# Patient Record
Sex: Female | Born: 1965 | Race: White | Hispanic: No | Marital: Married | Smoking: Current every day smoker
Health system: Southern US, Community
[De-identification: ages and names within clinical notes are randomized; demographics above are authoritative.]

## PROBLEM LIST (undated history)

## (undated) DIAGNOSIS — N809 Endometriosis, unspecified: Secondary | ICD-10-CM

## (undated) DIAGNOSIS — D4959 Neoplasm of unspecified behavior of other genitourinary organ: Secondary | ICD-10-CM

## (undated) DIAGNOSIS — F419 Anxiety disorder, unspecified: Secondary | ICD-10-CM

## (undated) DIAGNOSIS — A630 Anogenital (venereal) warts: Secondary | ICD-10-CM

## (undated) DIAGNOSIS — F32A Depression, unspecified: Secondary | ICD-10-CM

## (undated) DIAGNOSIS — M069 Rheumatoid arthritis, unspecified: Secondary | ICD-10-CM

## (undated) DIAGNOSIS — F329 Major depressive disorder, single episode, unspecified: Secondary | ICD-10-CM

## (undated) DIAGNOSIS — G43909 Migraine, unspecified, not intractable, without status migrainosus: Secondary | ICD-10-CM

## (undated) DIAGNOSIS — B019 Varicella without complication: Secondary | ICD-10-CM

## (undated) HISTORY — DX: Anogenital (venereal) warts: A63.0

## (undated) HISTORY — DX: Migraine, unspecified, not intractable, without status migrainosus: G43.909

## (undated) HISTORY — DX: Depression, unspecified: F32.A

## (undated) HISTORY — DX: Rheumatoid arthritis, unspecified: M06.9

## (undated) HISTORY — DX: Neoplasm of unspecified behavior of other genitourinary organ: D49.59

## (undated) HISTORY — PX: TONSILLECTOMY: SUR1361

## (undated) HISTORY — DX: Varicella without complication: B01.9

## (undated) HISTORY — DX: Anxiety disorder, unspecified: F41.9

## (undated) HISTORY — DX: Endometriosis, unspecified: N80.9

## (undated) HISTORY — DX: Major depressive disorder, single episode, unspecified: F32.9

---
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
## 1985-06-12 HISTORY — PX: BREAST BIOPSY: SHX20

## 2001-06-12 HISTORY — PX: ABDOMINAL HYSTERECTOMY: SHX81

## 2013-07-21 DIAGNOSIS — H9319 Tinnitus, unspecified ear: Secondary | ICD-10-CM | POA: Insufficient documentation

## 2013-07-21 DIAGNOSIS — Z981 Arthrodesis status: Secondary | ICD-10-CM | POA: Insufficient documentation

## 2013-07-21 DIAGNOSIS — M542 Cervicalgia: Secondary | ICD-10-CM | POA: Insufficient documentation

## 2013-07-21 DIAGNOSIS — F329 Major depressive disorder, single episode, unspecified: Secondary | ICD-10-CM | POA: Insufficient documentation

## 2013-07-21 DIAGNOSIS — M26609 Unspecified temporomandibular joint disorder, unspecified side: Secondary | ICD-10-CM | POA: Insufficient documentation

## 2013-07-21 DIAGNOSIS — H6980 Other specified disorders of Eustachian tube, unspecified ear: Secondary | ICD-10-CM | POA: Insufficient documentation

## 2013-07-21 DIAGNOSIS — F32A Depression, unspecified: Secondary | ICD-10-CM | POA: Insufficient documentation

## 2013-10-10 DIAGNOSIS — F419 Anxiety disorder, unspecified: Secondary | ICD-10-CM

## 2013-10-10 DIAGNOSIS — F329 Major depressive disorder, single episode, unspecified: Secondary | ICD-10-CM | POA: Insufficient documentation

## 2013-10-10 DIAGNOSIS — F32A Depression, unspecified: Secondary | ICD-10-CM | POA: Insufficient documentation

## 2013-10-10 DIAGNOSIS — K219 Gastro-esophageal reflux disease without esophagitis: Secondary | ICD-10-CM | POA: Insufficient documentation

## 2014-06-12 HISTORY — PX: FOOT SURGERY: SHX648

## 2017-01-03 ENCOUNTER — Ambulatory Visit (INDEPENDENT_AMBULATORY_CARE_PROVIDER_SITE_OTHER): Payer: Self-pay | Admitting: Primary Care

## 2017-01-03 ENCOUNTER — Encounter: Payer: Self-pay | Admitting: Primary Care

## 2017-01-03 VITALS — BP 112/70 | HR 72 | Temp 98.3°F | Ht 62.25 in | Wt 125.4 lb

## 2017-01-03 DIAGNOSIS — E785 Hyperlipidemia, unspecified: Secondary | ICD-10-CM

## 2017-01-03 DIAGNOSIS — Z0000 Encounter for general adult medical examination without abnormal findings: Secondary | ICD-10-CM

## 2017-01-03 DIAGNOSIS — Z1211 Encounter for screening for malignant neoplasm of colon: Secondary | ICD-10-CM

## 2017-01-03 DIAGNOSIS — M069 Rheumatoid arthritis, unspecified: Secondary | ICD-10-CM | POA: Insufficient documentation

## 2017-01-03 DIAGNOSIS — Z1231 Encounter for screening mammogram for malignant neoplasm of breast: Secondary | ICD-10-CM

## 2017-01-03 DIAGNOSIS — Z720 Tobacco use: Secondary | ICD-10-CM | POA: Diagnosis not present

## 2017-01-03 DIAGNOSIS — Z1239 Encounter for other screening for malignant neoplasm of breast: Secondary | ICD-10-CM

## 2017-01-03 LAB — LIPID PANEL
CHOLESTEROL: 291 mg/dL — AB (ref 0–200)
HDL: 52.4 mg/dL (ref >39.00)
NonHDL: 238.4
Total CHOL/HDL Ratio: 6
Triglycerides: 229 mg/dL — ABNORMAL HIGH (ref 0.0–149.0)
VLDL: 45.8 mg/dL — ABNORMAL HIGH (ref 0.0–40.0)

## 2017-01-03 LAB — COMPREHENSIVE METABOLIC PANEL
ALBUMIN: 4.7 g/dL (ref 3.5–5.2)
ALK PHOS: 59 U/L (ref 39–117)
ALT: 20 U/L (ref 0–35)
AST: 14 U/L (ref 0–37)
BILIRUBIN TOTAL: 0.9 mg/dL (ref 0.2–1.2)
BUN: 17 mg/dL (ref 6–23)
CO2: 31 mEq/L (ref 19–32)
CREATININE: 0.85 mg/dL (ref 0.40–1.20)
Calcium: 9.8 mg/dL (ref 8.4–10.5)
Chloride: 101 mEq/L (ref 96–112)
GFR: 74.99 mL/min (ref >60.00)
GLUCOSE: 98 mg/dL (ref 70–99)
POTASSIUM: 4.1 meq/L (ref 3.5–5.1)
SODIUM: 136 meq/L (ref 135–145)
TOTAL PROTEIN: 7.4 g/dL (ref 6.0–8.3)

## 2017-01-03 LAB — LDL CHOLESTEROL, DIRECT: LDL DIRECT: 191 mg/dL

## 2017-01-03 MED ORDER — VARENICLINE TARTRATE 0.5 MG X 11 & 1 MG X 42 PO MISC: ORAL | 0 refills | Status: DC

## 2017-01-03 MED ORDER — VARENICLINE TARTRATE 1 MG PO TABS: 1.0000 mg | ORAL_TABLET | Freq: Two times a day (BID) | ORAL | 1 refills | Status: DC

## 2017-01-03 NOTE — Progress Notes (Signed)
Subjective:    Patient ID: Miranda Lara, female    DOB: [birth year], 51 y.o.   MRN: [id number]  HPI   Ms. Clavin is a 51 year old female who presents today to establish care, complete physical, and discuss the problems mentioned below. Will obtain old records.  1) Rheumatoid Arthritis: Diagnosed several years ago. Currently not on treatment, previously managed on Meloxicam, Plaquenil, methotrexate (once with terrible side effects). Overall her symptoms are manageable.   2) Anxiety and Depression: Diagnosed years ago. Once managed on "anxiety" medication, didn't like the way it made her feel. Overall manages well off of medications.   3) Hyperlipidemia: Lipid panel above goal in May 2015 from [facility]. TC of 282, LDL of 202.  4) Tobacco Abuse: Smoker since the age of 49. Smoked off and on for years, more consistently over the last several years. She's smoking 1/2 PPD and is ready to quit. Recent diagnosis of acute bronchitis and is currently under treatment. She would like to try Chantix.   Immunizations: -Tetanus: Completed in 2015 -Influenza: Did not complete last season.    Diet: She endorses a healthy diet. Breakfast: Skips during weekdays, omelette, potatoes, bacon during weekends. Lunch: Avocado, nuts, crackers, tomatoes, sandwich Dinner: Chicken, hamburgers, vegetables, starch (rice, pasta) Snacks: Nuts, crackers, fruit, avocado Desserts: Occasionally Beverages: Coffee, Juice, Water, beer  Exercise: She does not currently exercise. Eye exam: Completed in early 2018 Dental exam: Completed semi-annually Colonoscopy: Never completed. Due. Pap Smear: Hysterectomy  Mammogram: Completed 2 years ago, normal. Due.   Review of Systems  Constitutional: Negative for unexpected weight change.  HENT: Negative for rhinorrhea.   Respiratory: Negative for cough and shortness of breath.        Improvement in cough since diagnosis of bronchitis.  Cardiovascular: Negative for chest  pain.  Gastrointestinal: Negative for constipation and diarrhea.  Genitourinary: Negative for difficulty urinating and menstrual problem.  Musculoskeletal: Negative for arthralgias and myalgias.  Skin: Negative for rash.  Allergic/Immunologic: Negative for environmental allergies.  Neurological: Negative for dizziness, numbness and headaches.  Psychiatric/Behavioral:       Denies concerns for anxiety or depression       Past Medical History:  Diagnosis Date  . Anxiety and depression   . Chickenpox   . Endometriosis   . Migraines   . Ovarian tumor   . Rheumatoid arthritis [facility])      Social History   Social History  . Marital status: Married    Spouse name: N/A  . Number of children: N/A  . Years of education: N/A   Occupational History  . Not on file.   Social History Main Topics  . Smoking status: Current Every Day Smoker    Packs/day: 0.50  . Smokeless tobacco: Never Used  . Alcohol use Yes  . Drug use: Unknown  . Sexual activity: Not on file   Other Topics Concern  . Not on file   Social History Narrative   Married.   2 children, 1 grandchild.   Works in [employer].   Enjoys yard work, spending time with family.    Originally from [place].     Past Surgical History:  Procedure Laterality Date  . ABDOMINAL HYSTERECTOMY  2003  . BREAST BIOPSY Left 1987  . FOOT SURGERY  2016  . [place]   ACF 5-6  . TONSILLECTOMY      Family History  Problem Relation Age of Onset  . Hypertension Mother   . Dementia Mother   .  Arthritis Maternal Grandmother   . Stroke Maternal Grandmother   . Hypertension Maternal Grandmother   . Dementia Maternal Grandmother   . Prostate cancer Maternal Grandfather   . Colon cancer Paternal Grandfather     No Known Allergies  No current outpatient prescriptions on file prior to visit.   No current facility-administered medications on file prior to visit.     BP 112/70   Pulse 72   Temp 98.3 F (36.8 C)  (Oral)   Ht 5' 2.25" (1.581 m)   Wt 125 lb 6.4 oz (56.9 kg)   SpO2 96%   BMI 22.75 kg/m    Objective:   Physical Exam  Constitutional: She is oriented to person, place, and time. She appears well-nourished.  HENT:  Right Ear: Tympanic membrane and ear canal normal.  Left Ear: Tympanic membrane and ear canal normal.  Nose: Nose normal.  Mouth/Throat: Oropharynx is clear and moist.  Eyes: Pupils are equal, round, and reactive to light. Conjunctivae and EOM are normal.  Neck: Neck supple. No thyromegaly present.  Cardiovascular: Normal rate and regular rhythm.   No murmur heard. Pulmonary/Chest: Effort normal and breath sounds normal. She has no rales.  Abdominal: Soft. Bowel sounds are normal. There is no tenderness.  Musculoskeletal: Normal range of motion.  Lymphadenopathy:    She has no cervical adenopathy.  Neurological: She is alert and oriented to person, place, and time. She has normal reflexes. No cranial nerve deficit.  Skin: Skin is warm and dry. No rash noted.  Psychiatric: She has a normal mood and affect.          Assessment & Plan:

## 2017-01-03 NOTE — Assessment & Plan Note (Signed)
Doing well off of medications.

## 2017-01-03 NOTE — Assessment & Plan Note (Signed)
Immunizations UTD. Mammogram due, pending. Colonoscopy due, referral placed. Commended her on her healthy diet, recommended regular exercise. Exam unremarkable. Labs pending. Will initiate Chantix for tobacco abuse.

## 2017-01-03 NOTE — Assessment & Plan Note (Signed)
Smoker since at 66. Ready to quit. Discussed several treatment options, she would like to try chantix.  Trial of chantix. Common side effects including rare risk of suicide ideation was discussed with the patient today.  Patient is instructed to go directly to the ED if this occurs.  We discussed that patient can continue to smoke for 1 week after starting chantix, but then must discontinue cigarettes.  He is also instructed to contact us prior to completion of the starter month pack for an rx for the continuation month pack.  5 minutes spent with patient today on tobacco cessation counseling.

## 2017-01-03 NOTE — Patient Instructions (Addendum)
Start Chantix for tobacco abuse. You must choose a quit date within the first 1-2 weeks. Follow the package instructions. Start with the Starting Month Pak, then refill the Continuing Month Pak.  Complete lab work prior to leaving today. I will notify you of your results once received.   Call the [facility] to schedule your mammogram.  You will be contacted regarding your referral to GI for the colonoscopy.  Please let us know if you have not heard back within one week.   Start exercising. You should be getting 150 minutes of moderate intensity exercise weekly.  Continue your efforts towards a healthy diet.  Follow up in 1 year for your annual exam or sooner if needed.  It was a pleasure to meet you today! Please don't hesitate to call me with any questions. Welcome to [facility]!

## 2017-01-03 NOTE — Assessment & Plan Note (Signed)
Noted from labs on [facility]. Will check Lipids today. Never on treatment for lipids.

## 2017-01-04 ENCOUNTER — Telehealth: Payer: Self-pay | Admitting: Primary Care

## 2017-01-04 NOTE — Telephone Encounter (Signed)
Patient returned Chan's call. Patient does want to start medication.  Patient said cholesterol medication can be called in to [facility].

## 2017-01-05 ENCOUNTER — Other Ambulatory Visit: Payer: Self-pay | Admitting: Primary Care

## 2017-01-05 DIAGNOSIS — E785 Hyperlipidemia, unspecified: Secondary | ICD-10-CM

## 2017-01-05 MED ORDER — ATORVASTATIN CALCIUM 20 MG PO TABS: 20.0000 mg | ORAL_TABLET | Freq: Every evening | ORAL | 3 refills | Status: DC

## 2017-01-05 NOTE — Telephone Encounter (Signed)
Spoken to patient and was notified medication has been sent to [facility]

## 2017-01-10 ENCOUNTER — Other Ambulatory Visit: Payer: Self-pay

## 2017-01-10 ENCOUNTER — Telehealth: Payer: Self-pay

## 2017-01-10 DIAGNOSIS — Z1211 Encounter for screening for malignant neoplasm of colon: Secondary | ICD-10-CM

## 2017-01-10 NOTE — Telephone Encounter (Signed)
Gastroenterology Pre-Procedure Review  Request Date: 02/27/17 Requesting Physician: Dr. Vicente Males  PATIENT REVIEW QUESTIONS: The patient responded to the following health history questions as indicated:    1. Are you having any GI issues? no 2. Do you have a personal history of Polyps? no 3. Do you have a family history of Colon Cancer or Polyps? yes (Mom ??Colon Polyps) 4. Diabetes Mellitus? no 5. Joint replacements in the past 12 months?no 6. Major health problems in the past 3 months?no 7. Any artificial heart valves, MVP, or defibrillator?no    MEDICATIONS & ALLERGIES:    Patient reports the following regarding taking any anticoagulation/antiplatelet therapy:   Plavix, Coumadin, Eliquis, Xarelto, Lovenox, Pradaxa, Brilinta, or Effient? no Aspirin? no  Patient confirms/reports the following medications:  Current Outpatient Prescriptions  Medication Sig Dispense Refill  . atorvastatin (LIPITOR) 20 MG tablet Take 1 tablet (20 mg total) by mouth every evening. 90 tablet 3  . PROVENTIL HFA 108 (90 Base) MCG/ACT inhaler INL 2 PFS ITL Q 4 H PRN  0  . varenicline (CHANTIX CONTINUING MONTH PAK) 1 MG tablet Take 1 tablet (1 mg total) by mouth 2 (two) times daily. 60 tablet 1  . varenicline (CHANTIX STARTING MONTH PAK) 0.5 MG X 11 & 1 MG X 42 tablet Take 0.5 mg by mouth once daily for 3 days, then increase to 0.5 mg twice daily for 4 days, then increase to 1 mg tablet twice daily. 53 tablet 0   No current facility-administered medications for this visit.     Patient confirms/reports the following allergies:  No Known Allergies  No orders of the defined types were placed in this encounter.   AUTHORIZATION INFORMATION Primary Insurance: 1D#: Group #:  Secondary Insurance: 1D#: Group #:  SCHEDULE INFORMATION: Date: 02/27/17 Time: Location:[facility]

## 2017-01-15 ENCOUNTER — Telehealth: Payer: Self-pay | Admitting: Gastroenterology

## 2017-01-15 NOTE — Telephone Encounter (Signed)
Patient LVM and needs to r/s her procedure for 02/27/17.

## 2017-01-15 NOTE — Telephone Encounter (Signed)
Pts colonscopy has been moved from 09/18 to 09/[birth year] because her birthday. Endo has been notified.

## 2017-01-30 ENCOUNTER — Ambulatory Visit
Admission: RE | Admit: 2017-01-30 | Discharge: 2017-01-30 | Disposition: A | Discharge status: Home / Self Care | Payer: 59 | Source: Ambulatory Visit | Attending: Primary Care | Admitting: Primary Care

## 2017-01-30 DIAGNOSIS — Z1239 Encounter for other screening for malignant neoplasm of breast: Secondary | ICD-10-CM

## 2017-01-30 DIAGNOSIS — Z1231 Encounter for screening mammogram for malignant neoplasm of breast: Secondary | ICD-10-CM | POA: Diagnosis present

## 2017-02-06 ENCOUNTER — Other Ambulatory Visit: Payer: Self-pay | Admitting: *Deleted

## 2017-02-06 ENCOUNTER — Inpatient Hospital Stay
Admission: RE | Admit: 2017-02-06 | Discharge: 2017-02-06 | Disposition: A | Discharge status: Home / Self Care | Payer: Self-pay | Source: Ambulatory Visit | Attending: *Deleted | Admitting: *Deleted

## 2017-02-06 DIAGNOSIS — Z9289 Personal history of other medical treatment: Secondary | ICD-10-CM

## 2017-02-14 ENCOUNTER — Telehealth: Payer: Self-pay | Admitting: Gastroenterology

## 2017-02-14 NOTE — Telephone Encounter (Signed)
Patient left a voice message to cancel per colonoscopy due to work. She will call us back to reschedule. I called [facility]

## 2017-03-01 ENCOUNTER — Encounter: Payer: Self-pay | Admitting: Primary Care

## 2017-03-01 ENCOUNTER — Ambulatory Visit (INDEPENDENT_AMBULATORY_CARE_PROVIDER_SITE_OTHER): Payer: 59 | Admitting: Primary Care

## 2017-03-01 VITALS — BP 118/70 | HR 71 | Temp 98.4°F | Ht 62.25 in | Wt 127.8 lb

## 2017-03-01 DIAGNOSIS — E785 Hyperlipidemia, unspecified: Secondary | ICD-10-CM

## 2017-03-01 LAB — HEPATIC FUNCTION PANEL
ALT: 21 U/L (ref 0–35)
AST: 21 U/L (ref 0–37)
Albumin: 4.2 g/dL (ref 3.5–5.2)
Alkaline Phosphatase: 61 U/L (ref 39–117)
BILIRUBIN TOTAL: 0.8 mg/dL (ref 0.2–1.2)
Bilirubin, Direct: 0.1 mg/dL (ref 0.0–0.3)
Total Protein: 6.5 g/dL (ref 6.0–8.3)

## 2017-03-01 LAB — LIPID PANEL
CHOL/HDL RATIO: 3
Cholesterol: 178 mg/dL (ref 0–200)
HDL: 54.7 mg/dL (ref >39.00)
LDL CALC: 95 mg/dL (ref 0–99)
NonHDL: 123.19
Triglycerides: 142 mg/dL (ref 0.0–149.0)
VLDL: 28.4 mg/dL (ref 0.0–40.0)

## 2017-03-01 NOTE — Progress Notes (Signed)
Subjective:    Patient ID: Miranda Lara, female    DOB: 01/25/1966, 51 y.o.   MRN: [id number]  HPI  Miranda Lara is a 51 year old female with a history of rheumatoid arthritis who presents today with a chief complaint of fatigue. She also reports body aches.   Her symptoms began about one month ago. Her body aches are located to her hands, feet, lower extremities, shoulders. She is currently managed on atorvastatin 20 mg which was initiated in late July 2018 for hyperlipidemia. She denies fevers, cough, sore throat, nausea, tick bites, weight changes, appetite changes.  Review of Systems  Constitutional: Positive for fatigue. Negative for appetite change, fever and unexpected weight change.  HENT: Negative for congestion.   Respiratory: Negative for cough and shortness of breath.   Cardiovascular: Negative for chest pain.  Gastrointestinal: Negative for nausea.  Musculoskeletal: Positive for arthralgias and myalgias. Negative for joint swelling.  Skin: Negative for rash.  Neurological: Negative for headaches.       Past Medical History:  Diagnosis Date  . Anxiety and depression   . Chickenpox   . Endometriosis   . Migraines   . Ovarian tumor   . Rheumatoid arthritis [facility])      Social History   Social History  . Marital status: Married    Spouse name: N/A  . Number of children: N/A  . Years of education: N/A   Occupational History  . Not on file.   Social History Main Topics  . Smoking status: Current Every Day Smoker    Packs/day: 0.50  . Smokeless tobacco: Never Used  . Alcohol use Yes  . Drug use: Unknown  . Sexual activity: Not on file   Other Topics Concern  . Not on file   Social History Narrative   Married.   2 children, 1 grandchild.   Works in [employer].   Enjoys yard work, spending time with family.    Originally from [place].     Past Surgical History:  Procedure Laterality Date  . ABDOMINAL HYSTERECTOMY  2003  . BREAST BIOPSY Left 1987  .  FOOT SURGERY  2016  . [place]   ACF 5-6  . TONSILLECTOMY      Family History  Problem Relation Age of Onset  . Hypertension Mother   . Dementia Mother   . Arthritis Maternal Grandmother   . Stroke Maternal Grandmother   . Hypertension Maternal Grandmother   . Dementia Maternal Grandmother   . Prostate cancer Maternal Grandfather   . Colon cancer Paternal Grandfather     No Known Allergies  Current Outpatient Prescriptions on File Prior to Visit  Medication Sig Dispense Refill  . atorvastatin (LIPITOR) 20 MG tablet Take 1 tablet (20 mg total) by mouth every evening. 90 tablet 3  . PROVENTIL HFA 108 (90 Base) MCG/ACT inhaler INL 2 PFS ITL Q 4 H PRN  0  . varenicline (CHANTIX CONTINUING MONTH PAK) 1 MG tablet Take 1 tablet (1 mg total) by mouth 2 (two) times daily. (Patient not taking: Reported on 03/01/2017) 60 tablet 1  . varenicline (CHANTIX STARTING MONTH PAK) 0.5 MG X 11 & 1 MG X 42 tablet Take 0.5 mg by mouth once daily for 3 days, then increase to 0.5 mg twice daily for 4 days, then increase to 1 mg tablet twice daily. (Patient not taking: Reported on 03/01/2017) 53 tablet 0   No current facility-administered medications on file prior to visit.  BP 118/70   Pulse 71   Temp 98.4 F (36.9 C) (Oral)   Ht 5' 2.25" (1.581 m)   Wt 127 lb 12.8 oz (58 kg)   SpO2 97%   BMI 23.19 kg/m    Objective:   Physical Exam  Constitutional: She appears well-nourished.  Neck: Neck supple.  Cardiovascular: Normal rate and regular rhythm.   Pulmonary/Chest: Effort normal and breath sounds normal.  Musculoskeletal: Normal range of motion.  Skin: Skin is warm and dry. No rash noted.          Assessment & Plan:

## 2017-03-01 NOTE — Patient Instructions (Signed)
Complete lab work prior to leaving today. I will notify you of your results once received.   Stop your atorvastatin medication for 2 weeks. Please update me on your symptoms at that time.  Timnath  It was a pleasure to see you today!

## 2017-03-01 NOTE — Assessment & Plan Note (Signed)
Repeat lipids and LFT's pending today. Highly suspect symptoms secondary to statin use given normal exam and HPI. Hold atorvastatin x 2 weeks, she will update [place] at that time. Consider low dose Crestor.

## 2017-03-06 ENCOUNTER — Ambulatory Visit: Admit: 2017-03-06 | Payer: 59 | Admitting: Gastroenterology

## 2017-03-06 SURGERY — COLONOSCOPY WITH PROPOFOL
Anesthesia: General

## 2017-03-15 ENCOUNTER — Telehealth: Payer: Self-pay | Admitting: Primary Care

## 2017-03-15 DIAGNOSIS — E785 Hyperlipidemia, unspecified: Secondary | ICD-10-CM

## 2017-03-15 NOTE — Telephone Encounter (Signed)
-----   Message from Pleas Koch, NP sent at 03/01/2017  7:36 AM EDT ----- Regarding: Myalgias Please check on patient since we stopped her atorvastatin, any body/muscle aches?

## 2017-03-16 MED ORDER — ROSUVASTATIN CALCIUM 5 MG PO TABS: 5.0000 mg | ORAL_TABLET | Freq: Every evening | ORAL | 0 refills | Status: DC

## 2017-03-16 NOTE — Telephone Encounter (Signed)
We need to change the atorvastatin to rosuvastatin (Crestor), I'll send this to her pharmacy.  Please reschedule labs for 6 weeks after she starts Crestor. Have her update me before then if she has any problems.

## 2017-03-16 NOTE — Telephone Encounter (Signed)
Spoken to patient and she stated that she is better since been off the atorvastatin.  She has been recently achy but not like before. Just letting Anda Kraft know.  Patient stated that she was wondering if she going to have a replacement medication. I also mention to her that her lab appt on 04/09/2017.  Can we wait to see how her levels are on the lab appt?

## 2017-03-19 NOTE — Telephone Encounter (Signed)
Spoken and notified patient of Kate's comments. Patient verbalized understanding.  Lab appt 04/23/2017

## 2017-03-20 ENCOUNTER — Ambulatory Visit (INDEPENDENT_AMBULATORY_CARE_PROVIDER_SITE_OTHER): Payer: 59 | Admitting: Primary Care

## 2017-03-20 ENCOUNTER — Encounter: Payer: Self-pay | Admitting: Primary Care

## 2017-03-20 VITALS — BP 114/72 | HR 69 | Temp 98.0°F | Ht 62.25 in | Wt 128.1 lb

## 2017-03-20 DIAGNOSIS — R5383 Other fatigue: Secondary | ICD-10-CM | POA: Diagnosis not present

## 2017-03-20 DIAGNOSIS — M069 Rheumatoid arthritis, unspecified: Secondary | ICD-10-CM

## 2017-03-20 LAB — SEDIMENTATION RATE: SED RATE: 7 mm/h (ref 0–30)

## 2017-03-20 LAB — CBC
HEMATOCRIT: 39.9 % (ref 36.0–46.0)
HEMOGLOBIN: 13.4 g/dL (ref 12.0–15.0)
MCHC: 33.7 g/dL (ref 30.0–36.0)
MCV: 101.3 fl — AB (ref 78.0–100.0)
Platelets: 243 10*3/uL (ref 150.0–400.0)
RBC: 3.94 Mil/uL (ref 3.87–5.11)
RDW: 12.6 % (ref 11.5–15.5)
WBC: 4.7 10*3/uL (ref 4.0–10.5)

## 2017-03-20 LAB — TSH: TSH: 1.34 u[IU]/mL (ref 0.35–4.50)

## 2017-03-20 NOTE — Assessment & Plan Note (Signed)
Over the past 2 months.  Could be secondary to potential RA flare, depression, other metabolic cause. Check labs today. Offered therapy for depression, she will think about this.

## 2017-03-20 NOTE — Patient Instructions (Signed)
Complete lab work prior to leaving today. I will notify you of your results once received.   Trial the rosuvastatin medication for high cholesterol and update me if your symptoms get worse.  It was a pleasure to see you today!

## 2017-03-20 NOTE — Assessment & Plan Note (Signed)
Recent arthralgias could be secondary to RA, was also on atorvastatin several weeks ago, has stopped since. Check RF, Sed rate, CBC, TSH today. Consider Cymbalta for treatment vs rheumatology referral.

## 2017-03-20 NOTE — Progress Notes (Signed)
Subjective:    Patient ID: Miranda Lara, female    DOB: 01-May-1966, 51 y.o.   MRN: [id number]  HPI  Miranda Lara is a 51 year old female who presents today with a chief complaint of fatigue. She also reports arthralgias. Previously managed on atorvastatin which caused an increase in symptoms, improved after a two week holiday. She was switched to rosuvastatin last week given increased symptoms with atorvastatin.  One week she started feeling "exhausted" despite a full nights sleep. Her fatigue began 2 months ago with progression of fatigue since. She's also experiencing joint aches to her ankles, knees, upper and lower extremities that began about 2 months ago after she started atorvastatin, improved one week after she stopped taking, then symptoms resumed. She does have a history of achy hands/feet but not to this extent. Previously followed with rheumatology and managed on methotrexate and other various medications without improvement.   She's also felt depressed/down intermittently for years and more so recently given her recent symptoms. She does endorse a history of difficulty resting until things at home are neat and tidy. She thinks she has some obsessive compulsive disorder.  PHQ 9 score of 12 today.   Review of Systems  Constitutional: Positive for fatigue.  HENT: Negative for trouble swallowing.   Respiratory: Negative for shortness of breath.   Cardiovascular: Negative for chest pain and palpitations.  Endocrine: Negative for cold intolerance.  Musculoskeletal: Positive for arthralgias and myalgias.  Psychiatric/Behavioral:       See HPI       Past Medical History:  Diagnosis Date  . Anxiety and depression   . Chickenpox   . Endometriosis   . Migraines   . Ovarian tumor   . Rheumatoid arthritis [facility])      Social History   Social History  . Marital status: Married    Spouse name: N/A  . Number of children: N/A  . Years of education: N/A   Occupational History  . Not  on file.   Social History Main Topics  . Smoking status: Current Every Day Smoker    Packs/day: 0.50  . Smokeless tobacco: Never Used  . Alcohol use Yes  . Drug use: Unknown  . Sexual activity: Not on file   Other Topics Concern  . Not on file   Social History Narrative   Married.   2 children, 1 grandchild.   Works in [employer].   Enjoys yard work, spending time with family.    Originally from [place].     Past Surgical History:  Procedure Laterality Date  . ABDOMINAL HYSTERECTOMY  2003  . BREAST BIOPSY Left 1987  . FOOT SURGERY  2016  . [place]   ACF 5-6  . TONSILLECTOMY      Family History  Problem Relation Age of Onset  . Hypertension Mother   . Dementia Mother   . Arthritis Maternal Grandmother   . Stroke Maternal Grandmother   . Hypertension Maternal Grandmother   . Dementia Maternal Grandmother   . Prostate cancer Maternal Grandfather   . Colon cancer Paternal Grandfather     No Known Allergies  Current Outpatient Prescriptions on File Prior to Visit  Medication Sig Dispense Refill  . PROVENTIL HFA 108 (90 Base) MCG/ACT inhaler INL 2 PFS ITL Q 4 H PRN  0  . rosuvastatin (CRESTOR) 5 MG tablet Take 1 tablet (5 mg total) by mouth every evening. (Patient not taking: Reported on 03/20/2017) 90 tablet 0  .  varenicline (CHANTIX CONTINUING MONTH PAK) 1 MG tablet Take 1 tablet (1 mg total) by mouth 2 (two) times daily. (Patient not taking: Reported on 03/01/2017) 60 tablet 1  . varenicline (CHANTIX STARTING MONTH PAK) 0.5 MG X 11 & 1 MG X 42 tablet Take 0.5 mg by mouth once daily for 3 days, then increase to 0.5 mg twice daily for 4 days, then increase to 1 mg tablet twice daily. (Patient not taking: Reported on 03/01/2017) 53 tablet 0   No current facility-administered medications on file prior to visit.     BP 114/72   Pulse 69   Temp 98 F (36.7 C) (Oral)   Ht 5' 2.25" (1.581 m)   Wt 128 lb 1.9 oz (58.1 kg)   SpO2 98%   BMI 23.25 kg/m     Objective:   Physical Exam  Constitutional: She appears well-nourished.  Neck: Neck supple. No thyromegaly present.  Cardiovascular: Normal rate.   Pulmonary/Chest: Effort normal.  Musculoskeletal:  No obvious joint swelling to hands. General decrease in ROM to bilateral hands.  Skin: Skin is warm and dry.  Psychiatric: She has a normal mood and affect.          Assessment & Plan:

## 2017-03-21 LAB — RHEUMATOID FACTOR

## 2017-03-22 ENCOUNTER — Other Ambulatory Visit: Payer: Self-pay | Admitting: Primary Care

## 2017-03-22 DIAGNOSIS — F330 Major depressive disorder, recurrent, mild: Secondary | ICD-10-CM | POA: Insufficient documentation

## 2017-03-22 DIAGNOSIS — M2550 Pain in unspecified joint: Secondary | ICD-10-CM

## 2017-03-22 MED ORDER — DULOXETINE HCL 20 MG PO CPEP: 20.0000 mg | ORAL_CAPSULE | Freq: Every day | ORAL | 1 refills | Status: DC

## 2017-03-22 NOTE — Assessment & Plan Note (Signed)
Rheumatology work up negative. Will start Cymbalta for symptoms, discussed this during her last visit, she has decided to proceed. Will call for an update in 2 weeks.

## 2017-03-22 NOTE — Assessment & Plan Note (Signed)
History of for most of her life, overall able to manage. Increased symptoms of depression over the last year.  Given history of arthralgias, will start Cymbalta 20 mg.  Discussed potential side effects at last visit, she verbalized understanding. Will call her for an update in 2 weeks.

## 2017-04-05 ENCOUNTER — Telehealth: Payer: Self-pay | Admitting: Primary Care

## 2017-04-05 NOTE — Telephone Encounter (Signed)
Please check on patient: How is she doing since we started Cymbalta for joint aches and depression?

## 2017-04-06 NOTE — Telephone Encounter (Signed)
Per DPR, left detail message of Kate's comments for patient to call back. 

## 2017-04-09 ENCOUNTER — Other Ambulatory Visit: Payer: 59

## 2017-04-10 NOTE — Telephone Encounter (Signed)
Per DPR, left detail message of Kate's comments for patient to call back. 

## 2017-04-17 ENCOUNTER — Ambulatory Visit: Payer: 59 | Admitting: Sports Medicine

## 2017-04-23 ENCOUNTER — Other Ambulatory Visit (INDEPENDENT_AMBULATORY_CARE_PROVIDER_SITE_OTHER): Payer: 59

## 2017-04-23 DIAGNOSIS — E785 Hyperlipidemia, unspecified: Secondary | ICD-10-CM

## 2017-04-23 LAB — HEPATIC FUNCTION PANEL
ALBUMIN: 4.4 g/dL (ref 3.5–5.2)
ALK PHOS: 51 U/L (ref 39–117)
ALT: 23 U/L (ref 0–35)
AST: 20 U/L (ref 0–37)
BILIRUBIN DIRECT: 0.1 mg/dL (ref 0.0–0.3)
TOTAL PROTEIN: 7 g/dL (ref 6.0–8.3)
Total Bilirubin: 0.7 mg/dL (ref 0.2–1.2)

## 2017-04-23 LAB — LIPID PANEL
CHOL/HDL RATIO: 5
Cholesterol: 233 mg/dL — ABNORMAL HIGH (ref 0–200)
HDL: 51.3 mg/dL (ref >39.00)
LDL CALC: 152 mg/dL — AB (ref 0–99)
NONHDL: 181.31
TRIGLYCERIDES: 149 mg/dL (ref 0.0–149.0)
VLDL: 29.8 mg/dL (ref 0.0–40.0)

## 2017-04-26 ENCOUNTER — Telehealth: Payer: Self-pay | Admitting: Internal Medicine

## 2017-04-26 NOTE — Telephone Encounter (Signed)
1. Probably not.  2. Yes, if she had cream and or sugar. Black coffee wouldn't have affected her cholesterol. 3. Yes, she was switched to Crestor. A) The medication is likely working. B) Perhaps a higher dose, I need the answer to #2 first C) No

## 2017-04-26 NOTE — Telephone Encounter (Signed)
Pt called in and was given result of lipid panel;  Pt has the following questions regarding her lab results: 1. She was out of her cholesterol medication for 4 days. Could this have affected her results? 2. Pt also had coffee prior to having labs drawn. Could this also have affected her results? 3. Pt says that she was on another medication for her cholesterol, but the side effects were so bad that she was changed to another medication.  Her concerns are:  A)  is the new med not working?  B)  does she need a higher dose?  C)  Does she need a new medication? Pt can be reached at [phone].  Will route to [place] pool.

## 2017-04-27 NOTE — Telephone Encounter (Signed)
Message left for patient to return my call.  

## 2017-05-01 NOTE — Telephone Encounter (Signed)
Per DPR, left detail message of Kate's comments.

## 2017-05-14 ENCOUNTER — Ambulatory Visit: Payer: 59 | Admitting: Family Medicine

## 2017-05-14 ENCOUNTER — Encounter: Payer: Self-pay | Admitting: Family Medicine

## 2017-05-14 VITALS — BP 118/80 | HR 80 | Temp 98.4°F | Wt 129.0 lb

## 2017-05-14 DIAGNOSIS — Z720 Tobacco use: Secondary | ICD-10-CM

## 2017-05-14 DIAGNOSIS — J22 Unspecified acute lower respiratory infection: Secondary | ICD-10-CM | POA: Diagnosis not present

## 2017-05-14 DIAGNOSIS — R21 Rash and other nonspecific skin eruption: Secondary | ICD-10-CM | POA: Insufficient documentation

## 2017-05-14 MED ORDER — PROVENTIL HFA 108 (90 BASE) MCG/ACT IN AERS: 2.0000 | INHALATION_SPRAY | Freq: Four times a day (QID) | RESPIRATORY_TRACT | 0 refills | Status: DC | PRN

## 2017-05-14 MED ORDER — BENZONATATE 100 MG PO CAPS: 100.0000 mg | ORAL_CAPSULE | Freq: Three times a day (TID) | ORAL | 0 refills | Status: DC | PRN

## 2017-05-14 NOTE — Progress Notes (Signed)
BP 118/80 (BP Location: Left Arm, Patient Position: Sitting, Cuff Size: Normal)   Pulse 80   Temp 98.4 F (36.9 C) (Oral)   Wt 129 lb (58.5 kg)   SpO2 95%   BMI 23.41 kg/m    CC: cough Subjective:    Patient ID: Miranda Lara, female    DOB: 1965/10/17, 51 y.o.   MRN: [id number]  HPI: Miranda Lara is a 51 y.o. female presenting on 05/14/2017 for Cough (productive. Started 05/09/17. Has also had some facial pain and head pressure. Has taken Mucinex and ibuprofien, helpful.) and postnasal drainage   1 wk h/o cough felt better over weekend, but started feeling worse today. Cough started in chest, has now traveled to head. Last week had facial pain, HA, tooth pain. PNdrainage. Face feels flushed. Mild R earache.   No fevers/chills, wheezing or dyspnea.   Had bronchitis earlier this year seen at [facility] treated with steroids, abx and proventil inhaler.  Current smoker 1/2 ppd. Planning to use chantix to help her quit.  Some possible childhood asthma.   Daughter sick at home.  Has tried echinacea, apple cider vinegar, mucinex which helped, ibuprofen.   By the way - would like spot on skin of breast checked - present and getting raised. Not itchy or tender.   Relevant past medical, surgical, family and social history reviewed and updated as indicated. Interim medical history since our last visit reviewed. Allergies and medications reviewed and updated. Outpatient Medications Prior to Visit  Medication Sig Dispense Refill  . DULoxetine (CYMBALTA) 20 MG capsule Take 1 capsule (20 mg total) by mouth daily. 30 capsule 1  . rosuvastatin (CRESTOR) 5 MG tablet Take 1 tablet (5 mg total) by mouth every evening. 90 tablet 0  . PROVENTIL HFA 108 (90 Base) MCG/ACT inhaler INL 2 PFS ITL Q 4 H PRN  0  . varenicline (CHANTIX CONTINUING MONTH PAK) 1 MG tablet Take 1 tablet (1 mg total) by mouth 2 (two) times daily. (Patient not taking: Reported on 03/01/2017) 60 tablet 1  . varenicline (CHANTIX STARTING  MONTH PAK) 0.5 MG X 11 & 1 MG X 42 tablet Take 0.5 mg by mouth once daily for 3 days, then increase to 0.5 mg twice daily for 4 days, then increase to 1 mg tablet twice daily. (Patient not taking: Reported on 03/01/2017) 53 tablet 0   No facility-administered medications prior to visit.      Per HPI unless specifically indicated in ROS section below Review of Systems     Objective:    BP 118/80 (BP Location: Left Arm, Patient Position: Sitting, Cuff Size: Normal)   Pulse 80   Temp 98.4 F (36.9 C) (Oral)   Wt 129 lb (58.5 kg)   SpO2 95%   BMI 23.41 kg/m   Wt Readings from Last 3 Encounters:  05/14/17 129 lb (58.5 kg)  03/20/17 128 lb 1.9 oz (58.1 kg)  03/01/17 127 lb 12.8 oz (58 kg)    Physical Exam  Constitutional: She appears well-developed and well-nourished. No distress.  HENT:  Head: Normocephalic and atraumatic.  Right Ear: Hearing, tympanic membrane, external ear and ear canal normal.  Left Ear: Hearing, tympanic membrane, external ear and ear canal normal.  Nose: Mucosal edema (nasal mucosal congestion and erythema) present. No rhinorrhea. Right sinus exhibits maxillary sinus tenderness. Right sinus exhibits no frontal sinus tenderness. Left sinus exhibits maxillary sinus tenderness. Left sinus exhibits no frontal sinus tenderness.  Mouth/Throat: Uvula is midline, oropharynx is clear and  moist and mucous membranes are normal. No oropharyngeal exudate, posterior oropharyngeal edema, posterior oropharyngeal erythema or tonsillar abscesses.  Eyes: Conjunctivae and EOM are normal. Pupils are equal, round, and reactive to light. No scleral icterus.  Neck: Normal range of motion. Neck supple.  Cardiovascular: Normal rate, regular rhythm, normal heart sounds and intact distal pulses.  No murmur heard. Pulmonary/Chest: Effort normal and breath sounds normal. No respiratory distress. She has no wheezes. She has no rales.  Lymphadenopathy:    She has no cervical adenopathy.  Skin:  Skin is warm and dry. Rash noted.  Slightly erythematous shiny mildly scaly patch L upper breast <1cm diameter  Nursing note and vitals reviewed.      Assessment & Plan:   Problem List Items Addressed This Visit    Acute respiratory infection - Primary    Anticipate viral given short duration, could be developing sinusitis/bronchitis. Supportive care reviewed. Upcoming trip out of town next week, pt hopeful for improvement prior to trip. Red flags to seek further care discussed. I did ask her to call me with an update if no improvement or worsening noted over next 3 days to consider abx course (likely zpack).       Skin rash    Slightly raised small spot on L upper breast - ?tinea versicolor vs dry skin. I suggested 1-2 wk course lotrimin, if no improvement consider seeing derm vs steroid course. Not consistent with inflammatory breast cancer.       Tobacco abuse    Encouraged smoking cessation. Reviewed chantix use.          Follow up plan: Return if symptoms worsen or fail to improve.  Ria Bush, MD

## 2017-05-14 NOTE — Assessment & Plan Note (Signed)
Anticipate viral given short duration, could be developing sinusitis/bronchitis. Supportive care reviewed. Upcoming trip out of town next week, pt hopeful for improvement prior to trip. Red flags to seek further care discussed. I did ask her to call me with an update if no improvement or worsening noted over next 3 days to consider abx course (likely zpack).

## 2017-05-14 NOTE — Assessment & Plan Note (Signed)
Encouraged smoking cessation. Reviewed chantix use.

## 2017-05-14 NOTE — Patient Instructions (Addendum)
For rash on breast try lotrimin antifungal cream twice daily for 1-2 weeks.  You have a respiratory infection, likely viral.  Viral infections usually take 7-10 days to resolve. The cough can last a few weeks to go away. Use medication as prescribed: tessalon perls.  Push fluids and plenty of rest. May use ibuprofen 400-600mg  with meals for inflammation.  If fever > 101 or worsening productive cough, or worsening head congestion/facial pain/sinus pressure after 10 days, let us know.  Call me with an update in 3 days if not improving as expected

## 2017-05-14 NOTE — Assessment & Plan Note (Signed)
Slightly raised small spot on L upper breast - ?tinea versicolor vs dry skin. I suggested 1-2 wk course lotrimin, if no improvement consider seeing derm vs steroid course. Not consistent with inflammatory breast cancer.

## 2017-06-29 ENCOUNTER — Other Ambulatory Visit: Payer: Self-pay | Admitting: Primary Care

## 2017-06-29 DIAGNOSIS — E785 Hyperlipidemia, unspecified: Secondary | ICD-10-CM

## 2017-07-27 DIAGNOSIS — M546 Pain in thoracic spine: Secondary | ICD-10-CM | POA: Insufficient documentation

## 2017-08-15 ENCOUNTER — Ambulatory Visit: Payer: 59 | Admitting: Primary Care

## 2017-08-15 ENCOUNTER — Encounter: Payer: Self-pay | Admitting: Primary Care

## 2017-08-15 VITALS — BP 122/76 | HR 77 | Temp 98.2°F | Ht 62.25 in | Wt 129.5 lb

## 2017-08-15 DIAGNOSIS — J3489 Other specified disorders of nose and nasal sinuses: Secondary | ICD-10-CM | POA: Diagnosis not present

## 2017-08-15 MED ORDER — FLUTICASONE PROPIONATE 50 MCG/ACT NA SUSP: 1.0000 | Freq: Two times a day (BID) | NASAL | 0 refills | Status: DC

## 2017-08-15 NOTE — Progress Notes (Signed)
Subjective:    Patient ID: Miranda Lara, female    DOB: July 13, 1965, 52 y.o.   MRN: [id number]  HPI  Miranda Lara is a 52 year old female with a history of rheumatoid arthritis (not managed on DMARD's) and tobacco abuse who presents today with a chief complaint of sinus pressure. She's also noticed a skin growth to the perineum.  1) Sinus Pressure: She also reports sore throat, cough, headaches, chills, body aches. Her symptoms began 3-4 days ago with a sore throat. She's taken Dayquil, Ibuprofen/Tylenol with some improvement. Her most bothersome symptom is headache/pressure. She was on a prednisone course one week ago for her back pain, currently followed by orthopedics.   2) Genital Warts: Diagnosed several years ago. Once saw dermatology who was able to take off the wart. She noticed a "bump" pop up to the left lower perineum about two months ago. She's not noticed continued growth since, but has had a difficult time visualizing the spot.   Review of Systems  Constitutional: Negative for chills, fatigue and fever.  HENT: Positive for congestion, sinus pressure and sore throat.   Respiratory: Positive for cough. Negative for shortness of breath.        Past Medical History:  Diagnosis Date  . Anxiety and depression   . Chickenpox   . Endometriosis   . Migraines   . Ovarian tumor   . Rheumatoid arthritis ([place])      Social History   Socioeconomic History  . Marital status: Married    Spouse name: Not on file  . Number of children: Not on file  . Years of education: Not on file  . Highest education level: Not on file  Social Needs  . Financial resource strain: Not on file  . Food insecurity - worry: Not on file  . Food insecurity - inability: Not on file  . Transportation needs - medical: Not on file  . Transportation needs - non-medical: Not on file  Occupational History  . Not on file  Tobacco Use  . Smoking status: Current Every Day Smoker    Packs/day: 0.50  . Smokeless  tobacco: Never Used  Substance and Sexual Activity  . Alcohol use: Yes  . Drug use: Not on file  . Sexual activity: Not on file  Other Topics Concern  . Not on file  Social History Narrative   Married.   2 children, 1 grandchild.   Works in [employer].   Enjoys yard work, spending time with family.    Originally from [place].     Past Surgical History:  Procedure Laterality Date  . ABDOMINAL HYSTERECTOMY  2003  . BREAST BIOPSY Left 1987  . FOOT SURGERY  2016  . [place]   ACF 5-6  . TONSILLECTOMY      Family History  Problem Relation Age of Onset  . Hypertension Mother   . Dementia Mother   . Arthritis Maternal Grandmother   . Stroke Maternal Grandmother   . Hypertension Maternal Grandmother   . Dementia Maternal Grandmother   . Prostate cancer Maternal Grandfather   . Colon cancer Paternal Grandfather     No Known Allergies  Current Outpatient Medications on File Prior to Visit  Medication Sig Dispense Refill  . DULoxetine (CYMBALTA) 20 MG capsule Take 1 capsule (20 mg total) by mouth daily. 30 capsule 1  . PROVENTIL HFA 108 (90 Base) MCG/ACT inhaler Inhale 2 puffs into the lungs every 6 (six) hours as needed for wheezing  or shortness of breath. 1 Inhaler 0  . rosuvastatin (CRESTOR) 5 MG tablet TAKE 1 TABLET(5 MG) BY MOUTH EVERY EVENING. 90 tablet 0  . varenicline (CHANTIX CONTINUING MONTH PAK) 1 MG tablet Take 1 tablet (1 mg total) by mouth 2 (two) times daily. (Patient not taking: Reported on 08/15/2017) 60 tablet 1  . varenicline (CHANTIX STARTING MONTH PAK) 0.5 MG X 11 & 1 MG X 42 tablet Take 0.5 mg by mouth once daily for 3 days, then increase to 0.5 mg twice daily for 4 days, then increase to 1 mg tablet twice daily. (Patient not taking: Reported on 08/15/2017) 53 tablet 0   No current facility-administered medications on file prior to visit.     BP 122/76   Pulse 77   Temp 98.2 F (36.8 C) (Oral)   Ht 5' 2.25" (1.581 m)   Wt 129 lb 8 oz (58.7 kg)    SpO2 99%   BMI 23.50 kg/m    Objective:   Physical Exam  Constitutional: She appears well-nourished.  HENT:  Right Ear: Tympanic membrane and ear canal normal.  Left Ear: Tympanic membrane and ear canal normal.  Nose: Mucosal edema present. Right sinus exhibits no maxillary sinus tenderness and no frontal sinus tenderness. Left sinus exhibits no maxillary sinus tenderness and no frontal sinus tenderness.  Mouth/Throat: Oropharynx is clear and moist.  Eyes: Conjunctivae are normal.  Neck: Neck supple.  Cardiovascular: Normal rate and regular rhythm.  Pulmonary/Chest: Effort normal and breath sounds normal. She has no wheezes. She has no rales.  Genitourinary:    There is no lesion on the right labia. There is no lesion on the left labia. No erythema in the vagina. No vaginal discharge found.  Genitourinary Comments: Two 1-2 mm circular lesions connected, located to left lower perineum.   Lymphadenopathy:    She has no cervical adenopathy.  Skin: Skin is warm and dry.          Assessment & Plan:  Genital Wart:  Located to left perineum x 2 months. Exam today representative of genital wart. Discussed treatment options including Aldara, she kindly declines and will update if she changes her mind.  Viral Sinusitis:   Sinus pressure, cough, headache x 3-4 days. Overall some improvement with OTC treatment. Exam today stable, no evidence of bacterial involvement. Rx for Flonase sent to pharmacy, continue Dayquil, Ibuprofen PRN. She will update Monday next week if symptoms persist.   Pleas Koch, NP

## 2017-08-15 NOTE — Patient Instructions (Signed)
Your symptoms are representative of a viral illness which will resolve on its own over time. Our goal is to treat your symptoms in order to aid your body in the healing process and to make you more comfortable.   Continue Dayquil as needed.  Nasal Congestion/Ear Pressure: Try using Flonase (fluticasone) nasal spray. Instill 1 spray in each nostril twice daily.   Please call me Monday next week if no improvement in your sinus symptoms.  Please notify me if you'd like to try the topical treatment as discussed.  It was a pleasure to see you today!

## 2017-09-19 ENCOUNTER — Other Ambulatory Visit: Payer: Self-pay | Admitting: Primary Care

## 2017-09-19 DIAGNOSIS — J3489 Other specified disorders of nose and nasal sinuses: Secondary | ICD-10-CM

## 2017-10-06 ENCOUNTER — Other Ambulatory Visit: Payer: Self-pay | Admitting: Primary Care

## 2017-10-06 DIAGNOSIS — E785 Hyperlipidemia, unspecified: Secondary | ICD-10-CM

## 2017-10-15 ENCOUNTER — Other Ambulatory Visit: Payer: Self-pay | Admitting: Primary Care

## 2017-10-15 NOTE — Telephone Encounter (Signed)
Copied from [place] [id number]. Topic: Quick Communication - Rx Refill/Question >> Oct 15, 2017 12:35 PM Yvette Rack wrote: Medication: rosuvastatin (CRESTOR) 5 MG tablet Has the patient contacted their pharmacy? No.new pharmacy (Agent: If no, request that the patient contact the pharmacy for the refill.) Preferred Pharmacy (with phone number or street name):   [id number] - [place], [address] [phone] (Phone) [phone] (Fax)     Agent: Please be advised that RX refills may take up to 3 business days. We ask that you follow-up with your pharmacy.

## 2017-10-16 NOTE — Telephone Encounter (Signed)
Called patient to clarify if she was changing her pharmacies. She requested a refill for crestor, which had already been filled on April 29th. I left a message on her voice mail to call her new pharmacy and have them request the prescription if she was indeed switching. And if she has questions to give [place] a call back.  Provider  Alma Friendly, NP

## 2017-11-19 ENCOUNTER — Other Ambulatory Visit: Payer: Self-pay

## 2017-11-19 ENCOUNTER — Ambulatory Visit (INDEPENDENT_AMBULATORY_CARE_PROVIDER_SITE_OTHER): Payer: 59

## 2017-11-19 ENCOUNTER — Ambulatory Visit: Payer: 59 | Admitting: Podiatry

## 2017-11-19 ENCOUNTER — Encounter: Payer: Self-pay | Admitting: Podiatry

## 2017-11-19 DIAGNOSIS — D2120 Benign neoplasm of connective and other soft tissue of unspecified lower limb, including hip: Secondary | ICD-10-CM

## 2017-11-19 DIAGNOSIS — M7710 Lateral epicondylitis, unspecified elbow: Secondary | ICD-10-CM | POA: Insufficient documentation

## 2017-11-19 DIAGNOSIS — M722 Plantar fascial fibromatosis: Secondary | ICD-10-CM

## 2017-11-19 NOTE — Patient Instructions (Signed)
Pre-Operative Instructions  Congratulations, you have decided to take an important step towards improving your quality of life.  You can be assured that the doctors and staff at [facility] will be with you every step of the way.  Here are some important things you should know:  1. Plan to be at the surgery center/hospital at least 1 (one) hour prior to your scheduled time, unless otherwise directed by the surgical center/hospital staff.  You must have a responsible adult accompany you, remain during the surgery and drive you home.  Make sure you have directions to the surgical center/hospital to ensure you arrive on time. 2. If you are having surgery at [facility] or [facility] hospitals, you will need a copy of your medical history and physical form from your family physician within one month prior to the date of surgery. We will give you a form for your primary physician to complete.  3. We make every effort to accommodate the date you request for surgery.  However, there are times where surgery dates or times have to be moved.  We will contact you as soon as possible if a change in schedule is required.   4. No aspirin/ibuprofen for one week before surgery.  If you are on aspirin, any non-steroidal anti-inflammatory medications (Mobic, Aleve, Ibuprofen) should not be taken seven (7) days prior to your surgery.  You make take Tylenol for pain prior to surgery.  5. Medications - If you are taking daily heart and blood pressure medications, seizure, reflux, allergy, asthma, anxiety, pain or diabetes medications, make sure you notify the surgery center/hospital before the day of surgery so they can tell you which medications you should take or avoid the day of surgery. 6. No food or drink after midnight the night before surgery unless directed otherwise by surgical center/hospital staff. 7. No alcoholic beverages 24-hours prior to surgery.  No smoking 24-hours prior or 24-hours after  surgery. 8. Wear loose pants or shorts. They should be loose enough to fit over bandages, boots, and casts. 9. Don't wear slip-on shoes. Sneakers are preferred. 10. Bring your boot with you to the surgery center/hospital.  Also bring crutches or a walker if your physician has prescribed it for you.  If you do not have this equipment, it will be provided for you after surgery. 11. If you have not been contacted by the surgery center/hospital by the day before your surgery, call to confirm the date and time of your surgery. 12. Leave-time from work may vary depending on the type of surgery you have.  Appropriate arrangements should be made prior to surgery with your employer. 13. Prescriptions will be provided immediately following surgery by your doctor.  Fill these as soon as possible after surgery and take the medication as directed. Pain medications will not be refilled on weekends and must be approved by the doctor. 14. Remove nail polish on the operative foot and avoid getting pedicures prior to surgery. 15. Wash the night before surgery.  The night before surgery wash the foot and leg well with water and the antibacterial soap provided. Be sure to pay special attention to beneath the toenails and in between the toes.  Wash for at least three (3) minutes. Rinse thoroughly with water and dry well with a towel.  Perform this wash unless told not to do so by your physician.  Enclosed: 1 Ice pack (please put in freezer the night before surgery)   1 Hibiclens skin cleaner     Pre-op instructions  If you have any questions regarding the instructions, please do not hesitate to call our office.  [place]: [address], [place], [place] [address] -- [phone]  [place]: [address] [place], [place] [address] -- [phone]  [place]: [address]  [place], [place] [address] -- [phone]  [place]: [address], [address], [place], [place] [address] -- [phone]  Website:  [email or web address] 

## 2017-11-26 NOTE — Progress Notes (Signed)
   HPI: 52 year old female presenting today as a new patient with a chief complaint of fibromas noted to bilateral feet that began over three years ago. She reports pain in the bilateral heels. She reports having surgery on the right foot for treatment three years ago but states the pain is now worse. Walking increases the pain. Patient is here for further evaluation and treatment.   Past Medical History:  Diagnosis Date  . Anxiety and depression   . Chickenpox   . Endometriosis   . Genital warts   . Migraines   . Ovarian tumor   . Rheumatoid arthritis [facility])       Physical Exam: General: The patient is alert and oriented x3 in no acute distress.  Dermatology: Skin is warm, dry and supple bilateral lower extremities. Negative for open lesions or macerations.  Vascular: Palpable pedal pulses bilaterally. No edema or erythema noted. Capillary refill within normal limits.  Neurological: Epicritic and protective threshold grossly intact bilaterally.   Musculoskeletal Exam: Palpable nodule noted to the plantar medial longitudinal arch of the bilateral feet. Pain with palpation also noted to the area. Range of motion within normal limits to all pedal and ankle joints bilateral. Muscle strength 5/5 in all groups bilateral.   Radiographic Exam:  Normal osseous mineralization. Joint spaces preserved. No fracture/dislocation/boney destruction.    Assessment: - plantar fibroma bilateral feet   Plan of Care:  - Patient evaluated. X-Rays reviewed.  - Today we discussed the conservative versus surgical management of the presenting pathology. The patient opts for surgical management. All possible complications and details of the procedure were explained. All patient questions were answered. No guarantees were expressed or implied. - Authorization for surgery was initiated today. Surgery will consist of excision of fibroma bilaterally.  - Billing specialist to contact patient regarding cost.  -  Return to clinic one week post op.       Edrick Kins, DPM [facility]  Dr. Edrick Kins, DPM    [address], [place] [address]                Office [phone]  Fax [phone]

## 2018-01-14 ENCOUNTER — Other Ambulatory Visit: Payer: Self-pay | Admitting: Primary Care

## 2018-01-14 DIAGNOSIS — E785 Hyperlipidemia, unspecified: Secondary | ICD-10-CM

## 2018-03-04 ENCOUNTER — Telehealth: Payer: Self-pay | Admitting: *Deleted

## 2018-03-04 NOTE — Telephone Encounter (Signed)
"  I just spoke to you a little bit ago.  I want to go ahead and schedule my surgery for October 17 if it's still available."  I'll get it scheduled.  Someone from the surgical center will give you a call a day or two prior to your surgery date.  They will give you your arrival time.  All you need to do is register with the surgical center.  The instructions to do that, are in the brochure that we gave you.

## 2018-03-04 NOTE — Telephone Encounter (Signed)
"  I saw Dr. Amalia Hailey a couple of months ago.  He said I needed surgery.  I need to know when I could possibly schedule my surgery."  Dr. Amalia Hailey' next available date is October 17.  "So he has time available from there on out?"  Yes, he does.  "How long will I be out of work?  I'm having a place removed on the bottom of both feet.  He said I could have them both done at the same time.  So my question is should I have them both done at the same time. I need to know because I am the only one on my job that does what I do."  You will probably have your stitches in for about three weeks.  You don't want to be up on your feett too much because you don't want to pop your stitches.  "What type of shoe will I be wearing?" You will be wearing surgical shoes.  "Okay, I'll call you back to schedule for some time in November."

## 2018-03-28 ENCOUNTER — Other Ambulatory Visit: Payer: Self-pay

## 2018-03-28 ENCOUNTER — Other Ambulatory Visit: Payer: Self-pay | Admitting: Podiatry

## 2018-03-28 ENCOUNTER — Encounter: Payer: Self-pay | Admitting: Podiatry

## 2018-03-28 DIAGNOSIS — M722 Plantar fascial fibromatosis: Secondary | ICD-10-CM

## 2018-03-28 MED ORDER — OXYCODONE-ACETAMINOPHEN 5-325 MG PO TABS: 1.0000 | ORAL_TABLET | Freq: Four times a day (QID) | ORAL | 0 refills | Status: DC | PRN

## 2018-03-28 MED ORDER — MELOXICAM 15 MG PO TABS: 15.0000 mg | ORAL_TABLET | Freq: Every day | ORAL | 1 refills | Status: DC

## 2018-03-28 MED ORDER — MELOXICAM 15 MG PO TABS: 15.0000 mg | ORAL_TABLET | Freq: Every day | ORAL | 0 refills | Status: DC

## 2018-03-28 NOTE — Progress Notes (Signed)
.  postop

## 2018-03-28 NOTE — Progress Notes (Unsigned)
Post op

## 2018-04-02 ENCOUNTER — Encounter: Payer: Self-pay | Admitting: Podiatry

## 2018-04-02 ENCOUNTER — Ambulatory Visit (INDEPENDENT_AMBULATORY_CARE_PROVIDER_SITE_OTHER): Payer: 59 | Admitting: Podiatry

## 2018-04-02 VITALS — BP 97/61 | HR 71 | Temp 97.9°F

## 2018-04-02 DIAGNOSIS — Z9889 Other specified postprocedural states: Secondary | ICD-10-CM

## 2018-04-02 DIAGNOSIS — D2120 Benign neoplasm of connective and other soft tissue of unspecified lower limb, including hip: Secondary | ICD-10-CM

## 2018-04-02 DIAGNOSIS — M722 Plantar fascial fibromatosis: Secondary | ICD-10-CM

## 2018-04-03 ENCOUNTER — Encounter: Payer: 59 | Admitting: Podiatry

## 2018-04-07 NOTE — Progress Notes (Signed)
   Subjective:  Patient presents today status post excision of bilateral fibromas. DOS: 03/28/18. She states she is doing well overall. She states the left foot is better than the right. She reports some burning nerve pain of the right foot with associated numbness in the toes. She states the pain has been tolerable with the pain medications. She denies any modifying factors. She denies nausea, vomiting, fever or chills.    Past Medical History:  Diagnosis Date  . Anxiety and depression   . Chickenpox   . Endometriosis   . Genital warts   . Migraines   . Ovarian tumor   . Rheumatoid arthritis ([place])       Objective/Physical Exam Neurovascular status intact.  Skin incisions appear to be well coapted with sutures and staples intact. No sign of infectious process noted. No dehiscence. No active bleeding noted. Moderate edema noted to the surgical extremity.  Assessment: 1. s/p excision of bilateral fibromas. DOS: 03/28/18   Plan of Care:  1. Patient was evaluated.  2. Dressing changed. Keep clean, dry and intact for one week.  3. Continue using post op shoes bilaterally.  4. Return to clinic in one week.     Edrick Kins, DPM [facility]  Dr. Edrick Kins, [place]                                        [place], [place] [address]                Office [phone]  Fax [phone]

## 2018-04-15 ENCOUNTER — Encounter: Payer: 59 | Admitting: Podiatry

## 2018-04-16 ENCOUNTER — Ambulatory Visit (INDEPENDENT_AMBULATORY_CARE_PROVIDER_SITE_OTHER): Payer: 59 | Admitting: Podiatry

## 2018-04-16 ENCOUNTER — Encounter: Payer: Self-pay | Admitting: Podiatry

## 2018-04-16 DIAGNOSIS — D2120 Benign neoplasm of connective and other soft tissue of unspecified lower limb, including hip: Secondary | ICD-10-CM

## 2018-04-16 DIAGNOSIS — M722 Plantar fascial fibromatosis: Secondary | ICD-10-CM

## 2018-04-16 MED ORDER — OXYCODONE-ACETAMINOPHEN 5-325 MG PO TABS: 1.0000 | ORAL_TABLET | Freq: Four times a day (QID) | ORAL | 0 refills | Status: DC | PRN

## 2018-04-16 NOTE — Progress Notes (Signed)
   Subjective:  Patient presents today status post excision of bilateral fibromas. DOS: 03/28/18.  Patient continues to have a significant amount of pain to the bilateral feet right greater than the left.  She also continues to have numbness to the plantar aspect of the right foot.  She has been taking pain medication nightly.  She is also been weightbearing in the postoperative shoes as directed.  She presents for further treatment evaluation  Past Medical History:  Diagnosis Date  . Anxiety and depression   . Chickenpox   . Endometriosis   . Genital warts   . Migraines   . Ovarian tumor   . Rheumatoid arthritis ([place])       Objective/Physical Exam Neurovascular status intact.  Paresthesia with numbness to the plantar aspect of the right forefoot.  Range of motion and muscle strength 5/5 all muscle compartments bilateral feet.  Skin incisions appear to be well coapted with sutures and staples intact. No sign of infectious process noted. No dehiscence. No active bleeding noted.  Edema of the right foot greater than left.  Edema otherwise moderate and as expected postoperatively. There is also a significant amount of tenderness to light touch and palpation to the incision sites in the plantar arches of the foot.  Assessment: 1. s/p excision of bilateral fibromas. DOS: 03/28/18   Plan of Care:  1. Patient was evaluated.  2.  Partial sutures were removed from the left foot incision site.  Due to the significant amount of pain we are going to wait until next week to remove the remaining sutures.  Recommend that the patient take a Percocet pain pill 30 minutes prior to office visit.  She will need someone to drive her since she will be on pain medication. 3.  Refill prescription for Percocet 5/3 and 25 mg 4.  The patient does have gabapentin at home secondary to the prior back injury.  Recommend that the patient resume gabapentin to see if it alleviates the sharp shooting stabbing pains that  she experiences intermittently. 5.  Patient may also begin to wash the foot.  Recommend antibiotic ointment and a Band-Aid overlying the incision sites.  Patient can also transition into good supportive sneakers. 6.  Return to clinic in 1 week for suture removal  Edrick Kins, DPM [facility]  Dr. Edrick Kins, [address]                                        [place], [place] [address]                Office [phone]  Fax [phone]

## 2018-04-23 ENCOUNTER — Encounter: Payer: Self-pay | Admitting: Podiatry

## 2018-04-23 ENCOUNTER — Ambulatory Visit (INDEPENDENT_AMBULATORY_CARE_PROVIDER_SITE_OTHER): Payer: 59 | Admitting: Podiatry

## 2018-04-23 DIAGNOSIS — D2120 Benign neoplasm of connective and other soft tissue of unspecified lower limb, including hip: Secondary | ICD-10-CM

## 2018-04-23 DIAGNOSIS — Z9889 Other specified postprocedural states: Secondary | ICD-10-CM

## 2018-04-23 DIAGNOSIS — M722 Plantar fascial fibromatosis: Secondary | ICD-10-CM

## 2018-04-23 MED ORDER — GABAPENTIN 100 MG PO CAPS: 100.0000 mg | ORAL_CAPSULE | Freq: Every day | ORAL | 0 refills | Status: DC

## 2018-04-24 NOTE — Progress Notes (Signed)
   Subjective:  Patient presents today status post excision of bilateral fibromas. DOS: 03/28/18. She reports some mild tenderness of the areas. She reports associated stiffness of the plantar aspects of the feet. She has been taking Gabapentin and Percocet for pain. There are no modifying factors noted. Patient is here for further evaluation and treatment.   Past Medical History:  Diagnosis Date  . Anxiety and depression   . Chickenpox   . Endometriosis   . Genital warts   . Migraines   . Ovarian tumor   . Rheumatoid arthritis ([place])       Objective/Physical Exam Neurovascular status intact.  Paresthesia with numbness to the plantar aspect of the right forefoot.  Range of motion and muscle strength 5/5 all muscle compartments bilateral feet.  Skin incisions appear to be well coapted with sutures and staples intact. No sign of infectious process noted. No dehiscence. No active bleeding noted.  Edema of the right foot greater than left.  Edema otherwise moderate and as expected postoperatively. There is also a significant amount of tenderness to light touch and palpation to the incision sites in the plantar arches of the foot.  Assessment: 1. s/p excision of bilateral fibromas. DOS: 03/28/18   Plan of Care:  1. Patient was evaluated.  2. Sutures removed.  3. Compression anklets dispensed bilaterally.  4. Prescription for Gabapentin 100 mg QHS provided to patient.  5. Return to clinic in 4 weeks.   Edrick Kins, DPM [facility]  Dr. Edrick Kins, [address]                                        [place], [place] [address]                Office [phone]  Fax [phone]

## 2018-05-07 ENCOUNTER — Other Ambulatory Visit: Payer: Self-pay | Admitting: Primary Care

## 2018-05-07 DIAGNOSIS — E785 Hyperlipidemia, unspecified: Secondary | ICD-10-CM

## 2018-05-23 ENCOUNTER — Other Ambulatory Visit: Payer: Self-pay

## 2018-05-23 MED ORDER — GABAPENTIN 100 MG PO CAPS: 100.0000 mg | ORAL_CAPSULE | Freq: Every day | ORAL | 0 refills | Status: DC

## 2018-05-23 NOTE — Progress Notes (Signed)
Reorder on Gabapentin 100MG  Sent to pharmacy on file

## 2018-05-27 NOTE — Progress Notes (Signed)
DOS   03/28/2018  Excision of plantar fibromas bilateral.

## 2018-05-28 ENCOUNTER — Encounter: Payer: Self-pay | Admitting: Podiatry

## 2018-05-28 ENCOUNTER — Ambulatory Visit (INDEPENDENT_AMBULATORY_CARE_PROVIDER_SITE_OTHER): Payer: 59 | Admitting: Podiatry

## 2018-05-28 DIAGNOSIS — M722 Plantar fascial fibromatosis: Secondary | ICD-10-CM

## 2018-05-28 DIAGNOSIS — Z9889 Other specified postprocedural states: Secondary | ICD-10-CM

## 2018-05-28 DIAGNOSIS — D2120 Benign neoplasm of connective and other soft tissue of unspecified lower limb, including hip: Secondary | ICD-10-CM

## 2018-05-28 MED ORDER — NONFORMULARY OR COMPOUNDED ITEM: 2 refills | Status: DC

## 2018-06-03 NOTE — Progress Notes (Signed)
   Subjective:  Patient presents today status post excision of bilateral fibromas. DOS: 03/28/18.  Patient continues to have pain and tenderness to the bilateral feet with the right greater than the left.  She still complains of some numbness to the plantar aspect of the right plantar foot that extends to the digits.  Increased symptoms to the right foot are expected since this was a revisional plantar fibroma resection versus the left foot.  She presents for further treatment evaluation  Past Medical History:  Diagnosis Date  . Anxiety and depression   . Chickenpox   . Endometriosis   . Genital warts   . Migraines   . Ovarian tumor   . Rheumatoid arthritis ([place])       Objective/Physical Exam Neurovascular status intact.  Paresthesia with numbness to the plantar aspect of the right forefoot.  Range of motion and muscle strength 5/5 all muscle compartments bilateral feet.  Skin incisions appear to be well coapted and healed with minimal scarring.  Negative for any significant edema.  Postsurgical edema appears to be resolved.  There is also a significant amount of tenderness to light touch and palpation to the incision sites in the plantar arches of the foot bilateral.  Underlying hard tissue with palpation also noted consistent with some scar tissue developing deep to the incision sites.  Assessment: 1. s/p excision of bilateral fibromas. DOS: 03/28/18 2.  Paresthesia right plantar foot 3.  Exquisite pain and tenderness bilateral feet   Plan of Care:  1. Patient was evaluated.  2.  Patient states that the gabapentin was not helping.  Discontinue gabapentin 3.  Today we had a discussion focusing on rehabbing the bilateral feet and incision sites.  Also discussed the importance of breaking up scar tissue.  Recommend that the patient perform deep tissue massage daily.  Physical therapy was offered however she is confident she can do all of the physical therapy exercises at home to break up  scar tissue and strengthen the foot. 4.  Prescription for anti-inflammatory pain cream ordered through [facility].  Recommend massaging the pain cream into her feet bilateral 2-3 times per day 5.  Also to help break up scar tissue injection of 0.5 cc Celestone Soluspan, without anesthetic, injected into the bilateral scar tissue.  Explained that this should help to break up scar tissue and reducing inflammation in the area 6.  Continue wearing good supportive shoe gear 7.  Return to clinic in 1 month  Edrick Kins, DPM [facility]  Dr. Edrick Kins, [address]                                        [place], [place] [address]                Office [phone]  Fax [phone]

## 2018-06-25 ENCOUNTER — Ambulatory Visit (INDEPENDENT_AMBULATORY_CARE_PROVIDER_SITE_OTHER): Payer: 59 | Admitting: Podiatry

## 2018-06-25 ENCOUNTER — Encounter: Payer: Self-pay | Admitting: Podiatry

## 2018-06-25 DIAGNOSIS — Z9889 Other specified postprocedural states: Secondary | ICD-10-CM

## 2018-06-25 DIAGNOSIS — M722 Plantar fascial fibromatosis: Secondary | ICD-10-CM

## 2018-06-25 DIAGNOSIS — D2120 Benign neoplasm of connective and other soft tissue of unspecified lower limb, including hip: Secondary | ICD-10-CM

## 2018-06-27 NOTE — Progress Notes (Signed)
   Subjective:  Patient presents today status post excision of bilateral fibromas. DOS: 03/28/18. She states she is doing well overall. She reports some continued soreness but states it is improving. The injections at the last visit helped to provided relief. She has been using a topical cream and massaging the feet daily. Patient is here for further evaluation and treatment.   Past Medical History:  Diagnosis Date  . Anxiety and depression   . Chickenpox   . Endometriosis   . Genital warts   . Migraines   . Ovarian tumor   . Rheumatoid arthritis ([place])       Objective/Physical Exam Neurovascular status intact.  Paresthesia with numbness to the plantar aspect of the right forefoot.  Range of motion and muscle strength 5/5 all muscle compartments bilateral feet.  Skin incisions appear to be well coapted and healed with minimal scarring.  Negative for any significant edema.  Postsurgical edema appears to be resolved.  There is also a significant amount of tenderness to light touch and palpation to the incision sites in the plantar arches of the foot bilateral.  Underlying hard tissue with palpation also noted consistent with some scar tissue developing deep to the incision sites.  Assessment: 1. s/p excision of bilateral fibromas. DOS: 03/28/18 2. Paresthesia right plantar foot 3. Exquisite pain and tenderness bilateral feet - improved    Plan of Care:  1. Patient was evaluated.  2. Continue using the topical compounded cream.  3. Continue daily massage.  4. Recommended good shoe gear.  5. Return to clinic in 3 months.     Edrick Kins, DPM [facility]  Dr. Edrick Kins, [address]                                        [place], [place] [address]                Office [phone]  Fax [phone]

## 2018-09-03 ENCOUNTER — Other Ambulatory Visit: Payer: Self-pay | Admitting: Podiatry

## 2018-09-03 MED ORDER — GABAPENTIN 100 MG PO CAPS: 100.0000 mg | ORAL_CAPSULE | Freq: Every day | ORAL | 1 refills | Status: DC

## 2018-09-03 NOTE — Progress Notes (Unsigned)
Refill request sent.   Edrick Kins, DPM [facility]  Dr. Edrick Kins, DPM    [address], [place] [address]                Office [phone]  Fax [phone]

## 2018-09-24 ENCOUNTER — Ambulatory Visit: Payer: 59 | Admitting: Podiatry

## 2018-10-08 ENCOUNTER — Ambulatory Visit (INDEPENDENT_AMBULATORY_CARE_PROVIDER_SITE_OTHER): Payer: 59 | Admitting: Podiatry

## 2018-10-08 ENCOUNTER — Encounter: Payer: Self-pay | Admitting: Podiatry

## 2018-10-08 ENCOUNTER — Other Ambulatory Visit: Payer: Self-pay

## 2018-10-08 VITALS — Temp 98.9°F

## 2018-10-08 DIAGNOSIS — D2120 Benign neoplasm of connective and other soft tissue of unspecified lower limb, including hip: Secondary | ICD-10-CM

## 2018-10-08 DIAGNOSIS — G5790 Unspecified mononeuropathy of unspecified lower limb: Secondary | ICD-10-CM | POA: Diagnosis not present

## 2018-10-08 DIAGNOSIS — M722 Plantar fascial fibromatosis: Secondary | ICD-10-CM | POA: Diagnosis not present

## 2018-10-08 NOTE — Progress Notes (Signed)
   Subjective:  Patient presents today status post excision of bilateral fibromas. DOS: 03/28/18.  Patient continues to have some pain and tenderness with numbness to the bilateral feet.  The numbness is more localized to the right foot where this was a secondary surgery excision of fibromas.  Patient states that the pain is constant and she has been dealing with it over the past few months.  Past Medical History:  Diagnosis Date  . Anxiety and depression   . Chickenpox   . Endometriosis   . Genital warts   . Migraines   . Ovarian tumor   . Rheumatoid arthritis ([place])       Objective/Physical Exam Neurovascular status intact.  Paresthesia with numbness to the plantar aspect of the right forefoot.  Range of motion and muscle strength 5/5 all muscle compartments bilateral feet.  Skin incisions appear to be well coapted and healed with minimal scarring.  Negative for any significant edema.  Postsurgical edema appears to be resolved.  There is also a significant amount of tenderness to light touch and palpation to the incision sites in the plantar arches of the foot bilateral.  Underlying hard tissue with palpation also noted consistent with some scar tissue developing deep to the incision sites.  Assessment: 1. s/p excision of bilateral fibromas. DOS: 03/28/18 2. Paresthesia right plantar foot 3. Exquisite pain and tenderness bilateral feet - improved    Plan of Care:  1. Patient was evaluated.  2.  Resume gabapentin 100 mg nightly. 3.  Continue wearing good supportive shoes 4.  I do believe that extracorporal shockwave therapy may be of some benefit to break up scar tissue and help with the adhesions of the feet.  Patient has experienced a significant amount of scar tissue with associated pain to bilateral feet.  I will see today if our office can arrange shockwave treatment in our [place] office 5.  Return to clinic as needed   Edrick Kins, DPM [facility]  Dr.  Edrick Kins, [address]                                        [place], [place] [address]                Office [phone]  Fax [phone]

## 2018-10-12 ENCOUNTER — Other Ambulatory Visit: Payer: Self-pay | Admitting: Primary Care

## 2018-10-12 DIAGNOSIS — E785 Hyperlipidemia, unspecified: Secondary | ICD-10-CM

## 2018-10-12 NOTE — Telephone Encounter (Signed)
Last OV Acute 08/2017 Last Lipid 04-23-17 No Future OV

## 2018-10-14 NOTE — Telephone Encounter (Signed)
Message left for patient to return my call.  

## 2018-10-14 NOTE — Telephone Encounter (Signed)
Patient needs CPE, we can do this virtually.  Will you please schedule?  Also, did she run out of her rosuvastatin? If so then when? If not the I can send in a few tablets until we can see her virtually.

## 2018-10-16 NOTE — Telephone Encounter (Signed)
Spoken to patient. She has to go back to work next week but was able to schedule CPE on tomorrow 10/17/2018. Patient stated that she did run out of the medication

## 2018-10-16 NOTE — Telephone Encounter (Signed)
Noted, will discuss at visit tomorrow.

## 2018-10-17 ENCOUNTER — Ambulatory Visit (INDEPENDENT_AMBULATORY_CARE_PROVIDER_SITE_OTHER): Payer: 59 | Admitting: Primary Care

## 2018-10-17 ENCOUNTER — Encounter: Payer: Self-pay | Admitting: Primary Care

## 2018-10-17 DIAGNOSIS — Z1211 Encounter for screening for malignant neoplasm of colon: Secondary | ICD-10-CM

## 2018-10-17 DIAGNOSIS — M069 Rheumatoid arthritis, unspecified: Secondary | ICD-10-CM | POA: Diagnosis not present

## 2018-10-17 DIAGNOSIS — Z0000 Encounter for general adult medical examination without abnormal findings: Secondary | ICD-10-CM

## 2018-10-17 DIAGNOSIS — D2120 Benign neoplasm of connective and other soft tissue of unspecified lower limb, including hip: Secondary | ICD-10-CM | POA: Insufficient documentation

## 2018-10-17 DIAGNOSIS — F329 Major depressive disorder, single episode, unspecified: Secondary | ICD-10-CM

## 2018-10-17 DIAGNOSIS — K219 Gastro-esophageal reflux disease without esophagitis: Secondary | ICD-10-CM | POA: Diagnosis not present

## 2018-10-17 DIAGNOSIS — F330 Major depressive disorder, recurrent, mild: Secondary | ICD-10-CM

## 2018-10-17 DIAGNOSIS — E785 Hyperlipidemia, unspecified: Secondary | ICD-10-CM

## 2018-10-17 DIAGNOSIS — Z1239 Encounter for other screening for malignant neoplasm of breast: Secondary | ICD-10-CM

## 2018-10-17 DIAGNOSIS — F419 Anxiety disorder, unspecified: Secondary | ICD-10-CM | POA: Diagnosis not present

## 2018-10-17 NOTE — Assessment & Plan Note (Signed)
Following with podiatry, taking Meloxicam PRN.

## 2018-10-17 NOTE — Progress Notes (Signed)
Subjective:    Patient ID: Miranda Lara, female    DOB: [birth year], 53 y.o.   MRN: [id number]  HPI  Virtual Visit via Video Note  I connected with Miranda Lara on 10/17/18 at  9:20 AM EDT by a video enabled telemedicine application and verified that I am speaking with the correct person using two identifiers.  Location: Patient: Home Provider: Office   I discussed the limitations of evaluation and management by telemedicine and the availability of in person appointments. The patient expressed understanding and agreed to proceed.  History of Present Illness:  Miranda Lara is a 53 year old female who presents today for complete physical.  Immunizations: -Tetanus: Completed in 2015 -Influenza: Due this season   Diet: She endorses a fair diet. Breakfast: Skips sometimes, [place] yogurt, eggs, peanut butter toast Lunch: Egg, cheese and crackers Dinner: Meat, vegetable, starch, tacos Snacks: Cheese and crackers Desserts: None Beverages: Coffee, water, sweet tea on occasion, beer on occasion   Exercise: She is doing some walking, active Eye exam: Completed 2 years ago Dental exam: Completes semi-annually  Colonoscopy: Never completed Pap Smear: Hysterectomy  Mammogram: No recent mammogram    Observations/Objective:  Alert and oriented. Appears well, not sickly. No distress. Speaking in complete sentences.   Assessment and Plan:  See problem based charting.  Follow Up Instructions:  Call [facility] to schedule your mammogram.  You will be contacted regarding your referral to GI for the colonoscopy.  Please let us know if you have not been contacted within one week.   Start exercising. You should be getting 150 minutes of moderate intensity exercise weekly.  Continue to work on a healthy diet.  Call the main line to schedule a lab only appointment.  It was a pleasure to see you today! Miranda Bossier, NP-C    I discussed the assessment and treatment plan with the  patient. The patient was provided an opportunity to ask questions and all were answered. The patient agreed with the plan and demonstrated an understanding of the instructions.   The patient was advised to call back or seek an in-person evaluation if the symptoms worsen or if the condition fails to improve as anticipated.     Miranda Koch, NP    Review of Systems  Constitutional: Negative for unexpected weight change.  HENT: Negative for rhinorrhea.   Respiratory: Negative for cough and shortness of breath.   Cardiovascular: Negative for chest pain.  Gastrointestinal: Negative for constipation and diarrhea.  Genitourinary: Negative for difficulty urinating.  Musculoskeletal: Positive for arthralgias.       Chronic foot pain  Skin: Negative for rash.  Allergic/Immunologic: Negative for environmental allergies.  Neurological: Negative for dizziness, numbness and headaches.  Psychiatric/Behavioral: The patient is not nervous/anxious.        Past Medical History:  Diagnosis Date  . Anxiety and depression   . Chickenpox   . Endometriosis   . Genital warts   . Migraines   . Ovarian tumor   . Rheumatoid arthritis ([place])      Social History   Socioeconomic History  . Marital status: Married    Spouse name: Not on file  . Number of children: Not on file  . Years of education: Not on file  . Highest education level: Not on file  Occupational History  . Not on file  Social Needs  . Financial resource strain: Not on file  . Food insecurity:    Worry: Not on file  Inability: Not on file  . Transportation needs:    Medical: Not on file    Non-medical: Not on file  Tobacco Use  . Smoking status: Current Every Day Smoker    Packs/day: 0.50  . Smokeless tobacco: Never Used  Substance and Sexual Activity  . Alcohol use: Yes  . Drug use: Not on file  . Sexual activity: Not on file  Lifestyle  . Physical activity:    Days per week: Not on file    Minutes per  session: Not on file  . Stress: Not on file  Relationships  . Social connections:    Talks on phone: Not on file    Gets together: Not on file    Attends religious service: Not on file    Active member of club or organization: Not on file    Attends meetings of clubs or organizations: Not on file    Relationship status: Not on file  . Intimate partner violence:    Fear of current or ex partner: Not on file    Emotionally abused: Not on file    Physically abused: Not on file    Forced sexual activity: Not on file  Other Topics Concern  . Not on file  Social History Narrative   Married.   2 children, 1 grandchild.   Works in [employer].   Enjoys yard work, spending time with family.    Originally from [place].     Past Surgical History:  Procedure Laterality Date  . ABDOMINAL HYSTERECTOMY  2003  . BREAST BIOPSY Left 1987  . FOOT SURGERY  2016  . [place]   ACF 5-6  . TONSILLECTOMY      Family History  Problem Relation Age of Onset  . Hypertension Mother   . Dementia Mother   . Arthritis Maternal Grandmother   . Stroke Maternal Grandmother   . Hypertension Maternal Grandmother   . Dementia Maternal Grandmother   . Prostate cancer Maternal Grandfather   . Colon cancer Paternal Grandfather     No Known Allergies  Current Outpatient Medications on File Prior to Visit  Medication Sig Dispense Refill  . fluticasone (FLONASE) 50 MCG/ACT nasal spray SHAKE LIQUID AND USE 1 SPRAY IN EACH NOSTRIL TWICE DAILY 16 g 5  . gabapentin (NEURONTIN) 100 MG capsule Take 1 capsule (100 mg total) by mouth at bedtime. 90 capsule 1  . meloxicam (MOBIC) 15 MG tablet Take 1 tablet (15 mg total) by mouth daily. 30 tablet 1  . NONFORMULARY OR COMPOUNDED ITEM See pharmacy note 120 each 2  . rosuvastatin (CRESTOR) 5 MG tablet Take 1 tablet (5 mg total) by mouth every evening. DUE FOR A FOLLOW UP FOR CHOLESTEROL 90 tablet 0   No current facility-administered medications on file prior  to visit.     There were no vitals taken for this visit.   Objective:   Physical Exam  Constitutional: She is oriented to person, place, and time. She appears well-nourished.  HENT:  Head: Normocephalic.  Eyes: Conjunctivae are normal.  Neck: Neck supple.  Respiratory: Effort normal.  Musculoskeletal: Normal range of motion.  Neurological: She is alert and oriented to person, place, and time.  Skin: Skin is dry.  Psychiatric: She has a normal mood and affect.           Assessment & Plan:

## 2018-10-17 NOTE — Assessment & Plan Note (Signed)
Overall doing well, no longer on any of her psychiatry medications. Continue to monitor.

## 2018-10-17 NOTE — Patient Instructions (Signed)
Call [facility] to schedule your mammogram.  You will be contacted regarding your referral to GI for the colonoscopy.  Please let us know if you have not been contacted within one week.   Start exercising. You should be getting 150 minutes of moderate intensity exercise weekly.  Continue to work on a healthy diet.  Call the main line to schedule a lab only appointment.  It was a pleasure to see you today! Allie Bossier, NP-C

## 2018-10-17 NOTE — Assessment & Plan Note (Signed)
Tetanus UTD. Mammogram overdue, orders placed. Colonoscopy overdue, referral placed. Recommended regular exercise, heathy diet. Exam unremarkable. Labs pending. Follow up in 1 year for CPE.

## 2018-10-17 NOTE — Assessment & Plan Note (Signed)
Denies symptoms of GERD.

## 2018-10-17 NOTE — Assessment & Plan Note (Signed)
Out of Crestor for two weeks, repeat lipid panel pending. Will send refills once lipid panel returns.

## 2018-10-17 NOTE — Assessment & Plan Note (Signed)
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 Overall doing well, not taking Plaquenil or Cymbalta. No recent follow up with Rheumatology.

## 2018-10-18 NOTE — Telephone Encounter (Signed)
Spoken to patient and she was so busy that she was unable to come this morning. She will call if she can make it this afternoon. She knows she is cutting it close for today. She stated that if not when she goes back to work on Monday and check what how her schedule would be first.

## 2018-10-18 NOTE — Telephone Encounter (Signed)
This patient was supposed to schedule a lab only appointment for cholesterol and other lab check, she never did. Can you speak with her about this so I can approve her refill?

## 2018-10-21 NOTE — Telephone Encounter (Addendum)
Patient did not answer the the phone. But FYI, of what she told me Friday.

## 2018-10-21 NOTE — Telephone Encounter (Signed)
Noted  

## 2018-10-22 ENCOUNTER — Ambulatory Visit: Payer: 59 | Admitting: Sports Medicine

## 2018-10-22 ENCOUNTER — Ambulatory Visit
Admission: RE | Admit: 2018-10-22 | Discharge: 2018-10-22 | Disposition: A | Discharge status: Home / Self Care | Payer: 59 | Source: Ambulatory Visit | Attending: Sports Medicine | Admitting: Sports Medicine

## 2018-10-22 ENCOUNTER — Encounter: Payer: Self-pay | Admitting: Sports Medicine

## 2018-10-22 ENCOUNTER — Other Ambulatory Visit: Payer: Self-pay

## 2018-10-22 VITALS — BP 129/88 | Ht 62.0 in | Wt 125.0 lb

## 2018-10-22 DIAGNOSIS — M25522 Pain in left elbow: Secondary | ICD-10-CM

## 2018-10-22 MED ORDER — PREDNISONE 10 MG PO TABS: ORAL_TABLET | ORAL | 0 refills | Status: DC

## 2018-10-22 NOTE — Progress Notes (Addendum)
HPI  CC: Burning pain in left arm  Miranda Lara is a 53 year old female presents for burning pain in her left arm.  She states this pain is been acutely worse for last 2 weeks.  She states she is gotten numbness in her arm off and on again for the past several years.  She does have a history of carpal tunnel in both arms, with surgery on the right arm.  She also has a history of C5-C6 anterior cervical fusion, which was 53 years old.  She states is does not feel like either this pain.  She states that it is a burning pain in the palm of her left hand.  She states she sometimes feels it up in the medial side of her elbow and radiating down the anterior portion of her forearm.  She states this is usually preceded by numbness followed by pins-and-needles, followed by a burning sensation.  She is been taking gabapentin 100 mg at night, which has not helped much.  She states it wakes her up at night sometimes with a burning pain.  She denies any weakness of handgrip.  She denies any recent trauma to her neck or to her arm.  Past Injuries: History of carpal tunnel Past Surgeries: History of C5-C6 anterior cervical fusion Smoking: Current half pack a day smoker Family Hx: Noncontributory  ROS: Per HPI; in addition no fever, no rash, no additional weakness, no additional numbness, no additional paresthesias, and no additional falls/injury.   All past medical history, medications, and allergies reviewed myself at today's visit.  Objective: BP 129/88   Ht 5\' 2"  (1.575 m)   Wt 125 lb (56.7 kg)   BMI 22.86 kg/m  Gen:  NAD, well groomed, a/o x3, normal affect.  CV: Well-perfused. Warm.  Resp: Non-labored.  Neuro: Decreased sensation over palmar surface of left hand, end of the second third and fourth digits.   Gait: Nonpathologic posture, unremarkable stride without signs of limp or balance issues.  Left elbow exam: No erythema, warmth, swelling noted.  Tenderness palpation of the medial epicondyle.   Full range of motion extension and flexion of the elbow.  Strength out of 5 throughout testing.  Negative Tinel sign over the cubital tunnel.  Triceps reflex 2+.  Left wrist exam: No erythema, warmth, swelling noted.  Pain over the palmar surface, described as burning.  Full range of motion of flexion extension of the wrist.  Strength out of 5 throughout abduction flexion, and extension of the wrist.  Equal handgrip bilaterally.  Brachial reflex 2+. positive Tinel sign   Assessment and Plan: Left upper extremity paresthesia, carpal tunnel versus C7 cervical radiculopathy.  We discussed treatment options at today's visit.  At this time I like to obtain cervical spine x-rays to evaluate the disc space.  I will start her on B6 for nerve conduction.  She continue take gabapentin as well for nerve pain.  We also put her on a prednisone Dosepak to help acutely.  She should be wearing night splints to see if this helps with the pain.  If it does, is likely carpal tunnel.  She has no improvement on these modalities, will consider doing a nerve conduction study to further evaluate.  Miranda Rife, MD [facility] 10/22/2018 3:44 PM  Patient seen and evaluated with the sports medicine fellow.  I agree with the above plan of care.  Patient's symptoms are definitely neuropathic but it is difficult to tell whether it is at the  wrist, elbow, or more proximally at the neck.  She does have a history of a prior cervical spine fusion.  I would like to get some cervical spine x-rays.  She is quite uncomfortable so we will place her on a 6-day Sterapred Dosepak and she will continue with 100 mg of gabapentin which she takes chronically for her feet.  I have also recommended that she try cock up wrist brace at night when sleeping.  Follow-up with me again in 3 weeks for reevaluation.  If symptoms are not improving, consider EMG/nerve conduction study.  Addendum: X-rays reviewed.  Fusion of C5-C6.   There is moderate disc space narrowing at C6-C7 as well as narrowing of the left C4-C5 and right C6-C7 neuroforamina due to osteophyte encroachment.

## 2018-10-22 NOTE — Patient Instructions (Signed)
Thank you for coming to see [place] today in clinic.  You were evaluated today for nerve pain in your hand.  At this time we will obtain x-rays of your neck to evaluate for pathology that could be related to the numbness and tingling in your hands.  We will also start you on vitamin B6 and a prednisone Dosepak.  We recommend you use a night splint over your wrist to see if this helps alleviate it.  This is to see if you have carpal tunnel syndrome.  If you get no improvement on any of these modalities, we will consider getting a nerve conduction study in the near future.

## 2018-10-23 ENCOUNTER — Encounter: Payer: Self-pay | Admitting: Sports Medicine

## 2018-10-31 ENCOUNTER — Encounter: Payer: Self-pay | Admitting: *Deleted

## 2018-11-08 ENCOUNTER — Ambulatory Visit (HOSPITAL_BASED_OUTPATIENT_CLINIC_OR_DEPARTMENT_OTHER): Payer: 59

## 2018-11-11 ENCOUNTER — Other Ambulatory Visit: Payer: Self-pay

## 2018-11-11 ENCOUNTER — Encounter: Payer: Self-pay | Admitting: Sports Medicine

## 2018-11-11 ENCOUNTER — Ambulatory Visit (INDEPENDENT_AMBULATORY_CARE_PROVIDER_SITE_OTHER): Payer: 59 | Admitting: Sports Medicine

## 2018-11-11 VITALS — Ht 62.0 in | Wt 125.0 lb

## 2018-11-11 DIAGNOSIS — M25522 Pain in left elbow: Secondary | ICD-10-CM

## 2018-11-11 NOTE — Progress Notes (Signed)
Patient ID: Miranda Lara, female   DOB: 23-Apr-1966, 53 y.o.   MRN: [id number]  This visit was provided via telemedicine Patient was located in her office Physician was located in his office  I spoke with Miranda Lara on the phone today in follow-up regarding her left arm radiculopathy.  6-day Sterapred Dosepak was incredibly helpful but her symptoms have begun to return.  Her primary complaint is constant numbness in the middle finger of her left hand.  She does get burning pain in the palm of the hand as well with occasional radiating pain up the forearm to the elbow.  She denies pain proximal to the elbow.  She has been wearing her cock-up wrist braces at night which has been somewhat helpful.  She does not endorse any weakness.   Physical exam was not possible due to the nature of this visit.  X-rays of her cervical spine show an anterior cervical fusion at C5-C6 as well as moderate disc space narrowing at C6-C7.  I explained to Miranda Lara that the differential diagnosis at this time includes carpal tunnel syndrome as well as cervical radiculopathy, specifically the C7 nerve root.  I recommended that we evaluate this further with an EMG/nerve conduction study.  If at all possible, she would like to have this done in [place].  I will try to locate a neurology office in [facility] that does this study and we will contact her sometime in the next day or two.

## 2018-11-11 NOTE — Progress Notes (Signed)
   Subjective:    Patient ID: Miranda Lara, female    DOB: 08/29/1965, 53 y.o.   MRN: [id number]  HPI    Review of Systems     Objective:   Physical Exam        Assessment & Plan:

## 2018-11-11 NOTE — Addendum Note (Signed)
Addended by: Cyd Silence on: 11/11/2018 02:16 PM   Modules accepted: Orders

## 2019-01-03 ENCOUNTER — Ambulatory Visit (HOSPITAL_BASED_OUTPATIENT_CLINIC_OR_DEPARTMENT_OTHER)
Admission: RE | Admit: 2019-01-03 | Discharge: 2019-01-03 | Disposition: A | Discharge status: Home / Self Care | Payer: 59 | Source: Ambulatory Visit | Attending: Primary Care | Admitting: Primary Care

## 2019-01-03 ENCOUNTER — Other Ambulatory Visit: Payer: Self-pay

## 2019-01-03 DIAGNOSIS — Z1239 Encounter for other screening for malignant neoplasm of breast: Secondary | ICD-10-CM

## 2019-01-03 DIAGNOSIS — Z1231 Encounter for screening mammogram for malignant neoplasm of breast: Secondary | ICD-10-CM | POA: Diagnosis present

## 2019-01-14 ENCOUNTER — Other Ambulatory Visit: Payer: Self-pay | Admitting: Primary Care

## 2019-01-14 DIAGNOSIS — E785 Hyperlipidemia, unspecified: Secondary | ICD-10-CM

## 2019-01-31 ENCOUNTER — Other Ambulatory Visit: Payer: Self-pay | Admitting: Primary Care

## 2019-01-31 ENCOUNTER — Other Ambulatory Visit (INDEPENDENT_AMBULATORY_CARE_PROVIDER_SITE_OTHER): Payer: 59

## 2019-01-31 DIAGNOSIS — E785 Hyperlipidemia, unspecified: Secondary | ICD-10-CM | POA: Diagnosis not present

## 2019-01-31 LAB — COMPREHENSIVE METABOLIC PANEL
ALT: 25 U/L (ref 0–35)
AST: 23 U/L (ref 0–37)
Albumin: 4.6 g/dL (ref 3.5–5.2)
Alkaline Phosphatase: 59 U/L (ref 39–117)
BUN: 13 mg/dL (ref 6–23)
CO2: 30 mEq/L (ref 19–32)
Calcium: 9.3 mg/dL (ref 8.4–10.5)
Chloride: 104 mEq/L (ref 96–112)
Creatinine, Ser: 0.91 mg/dL (ref 0.40–1.20)
GFR: 64.68 mL/min (ref >60.00)
Glucose, Bld: 90 mg/dL (ref 70–99)
Potassium: 4.5 mEq/L (ref 3.5–5.1)
Sodium: 139 mEq/L (ref 135–145)
Total Bilirubin: 0.7 mg/dL (ref 0.2–1.2)
Total Protein: 6.7 g/dL (ref 6.0–8.3)

## 2019-01-31 LAB — LIPID PANEL
Cholesterol: 233 mg/dL — ABNORMAL HIGH (ref 0–200)
HDL: 52 mg/dL (ref >39.00)
LDL Cholesterol: 157 mg/dL — ABNORMAL HIGH (ref 0–99)
NonHDL: 180.54
Total CHOL/HDL Ratio: 4
Triglycerides: 119 mg/dL (ref 0.0–149.0)
VLDL: 23.8 mg/dL (ref 0.0–40.0)

## 2019-01-31 MED ORDER — ROSUVASTATIN CALCIUM 5 MG PO TABS: 5.0000 mg | ORAL_TABLET | Freq: Every evening | ORAL | 3 refills | Status: DC

## 2019-02-03 DIAGNOSIS — E785 Hyperlipidemia, unspecified: Secondary | ICD-10-CM

## 2019-02-03 MED ORDER — ROSUVASTATIN CALCIUM 10 MG PO TABS: 10.0000 mg | ORAL_TABLET | Freq: Every day | ORAL | 0 refills | Status: DC

## 2019-04-22 IMAGING — MG MM DIGITAL SCREENING BILAT W/ CAD
4 series · 4 of 4 positions shown · non-contrast
Comparison: Previous exam(s).

CLINICAL DATA: Screening.

EXAM:
DIGITAL SCREENING BILATERAL MAMMOGRAM WITH CAD

[L CC]
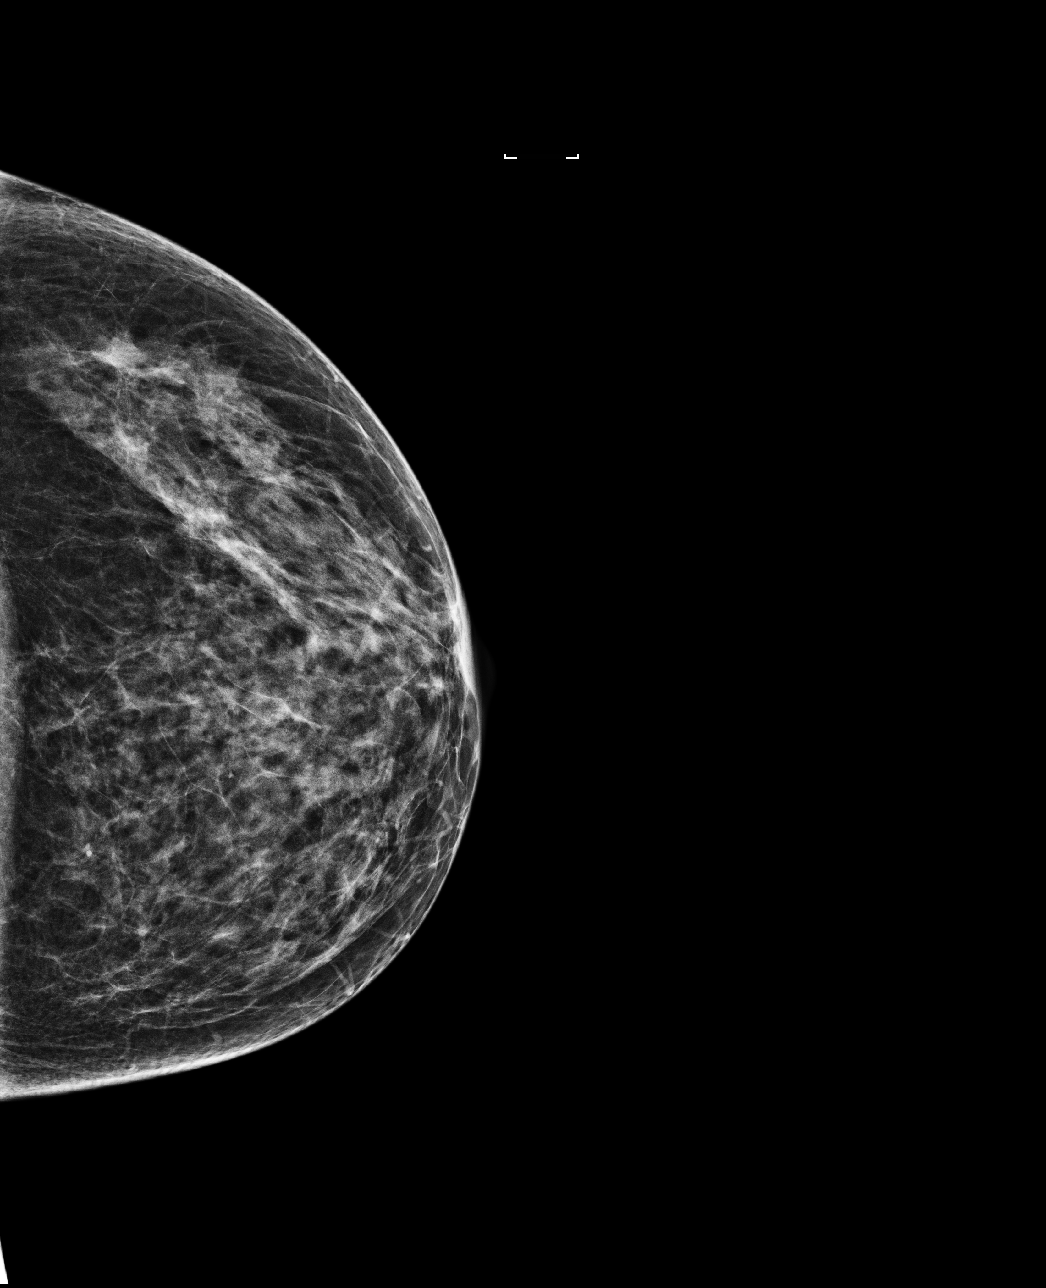

[R CC]
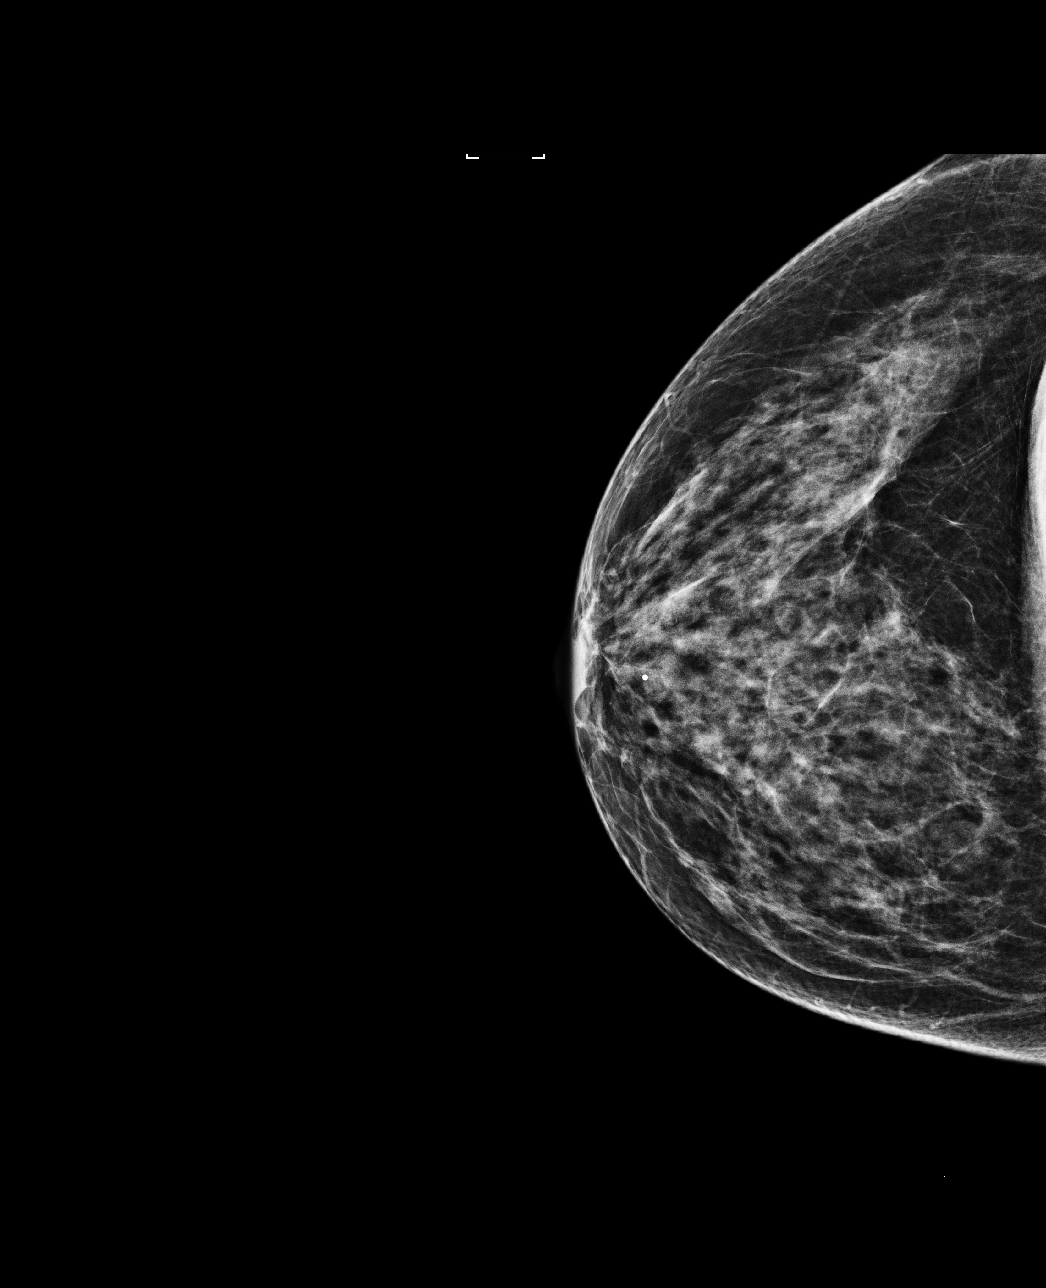

[R MLO]
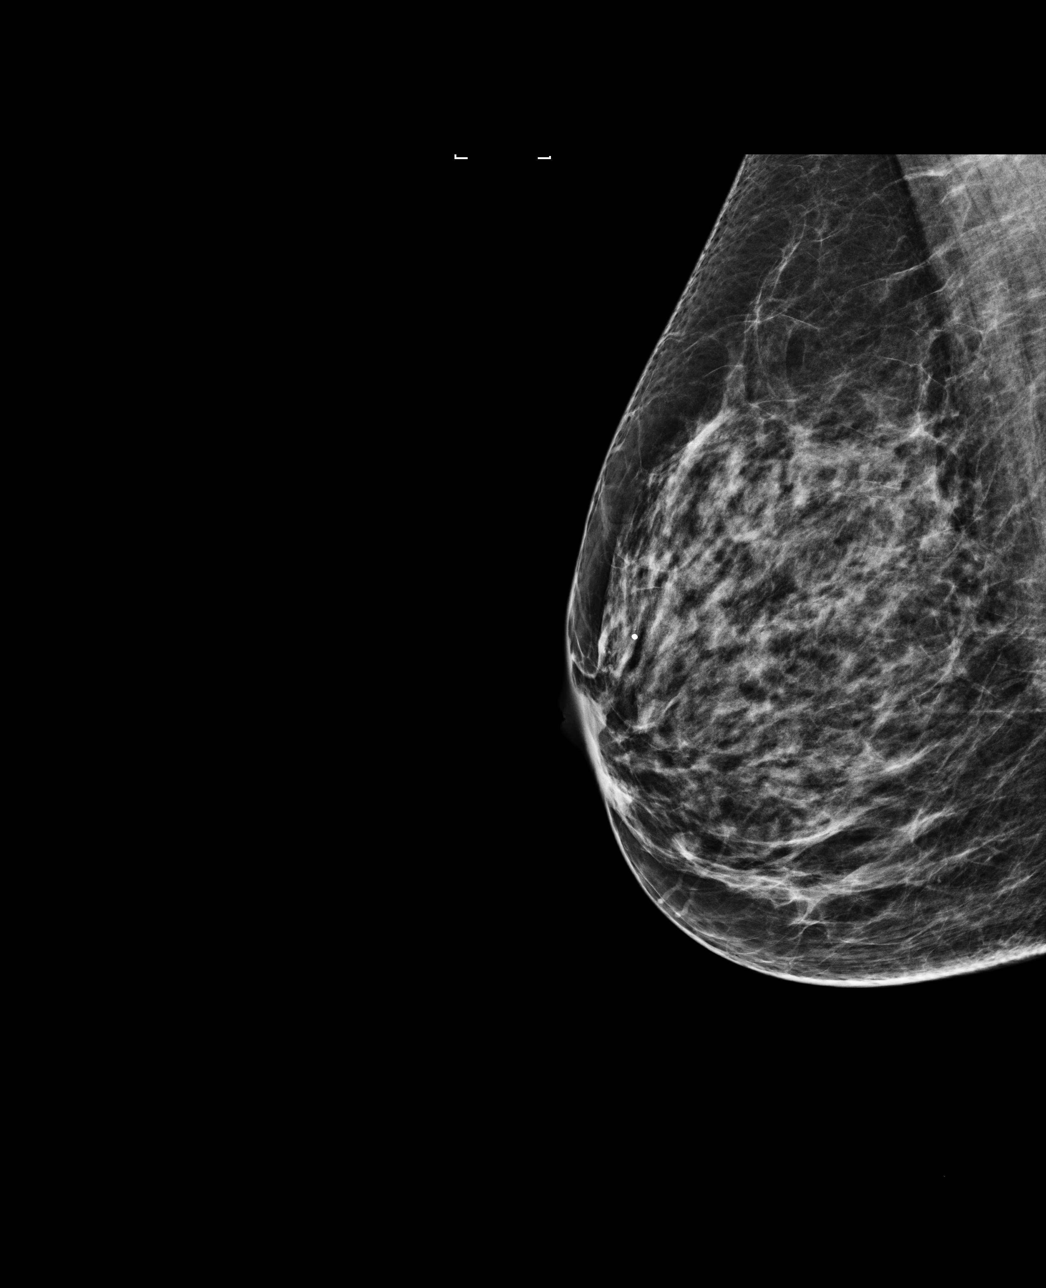

[L MLO]
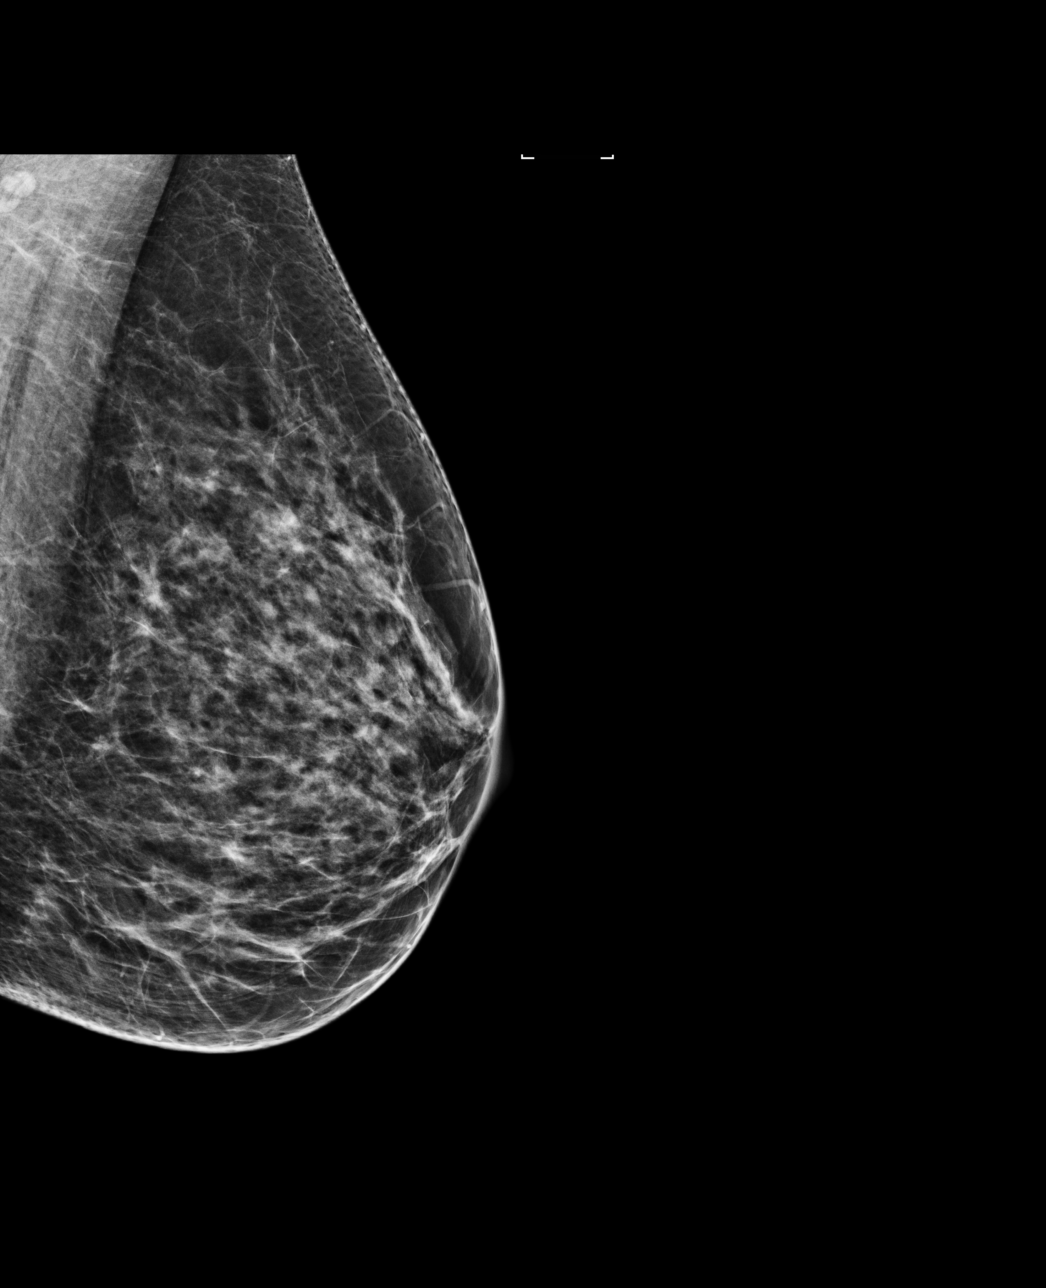

[4 of 4 positions shown; findings below may reference images not displayed]

ACR Breast Density Category c: The breast tissue is heterogeneously
dense, which may obscure small masses.
FINDINGS: There are no findings suspicious for malignancy. Images were
processed with CAD.
IMPRESSION: No mammographic evidence of malignancy. A result letter of this
screening mammogram will be mailed directly to the patient.

RECOMMENDATION:
Screening mammogram in one year. (Code:[id number])

BI-RADS CATEGORY  1: Negative.

## 2019-06-03 ENCOUNTER — Other Ambulatory Visit: Payer: Self-pay | Admitting: Primary Care

## 2019-06-03 DIAGNOSIS — E785 Hyperlipidemia, unspecified: Secondary | ICD-10-CM

## 2019-09-08 ENCOUNTER — Other Ambulatory Visit: Payer: Self-pay | Admitting: Primary Care

## 2019-09-08 DIAGNOSIS — E785 Hyperlipidemia, unspecified: Secondary | ICD-10-CM

## 2021-02-01 ENCOUNTER — Encounter: Payer: Self-pay | Admitting: Family Medicine

## 2021-02-01 ENCOUNTER — Ambulatory Visit (INDEPENDENT_AMBULATORY_CARE_PROVIDER_SITE_OTHER): Payer: Self-pay | Admitting: Family Medicine

## 2021-02-01 ENCOUNTER — Other Ambulatory Visit: Payer: Self-pay

## 2021-02-01 VITALS — BP 110/70 | HR 76 | Temp 98.6°F | Ht 62.0 in | Wt 125.2 lb

## 2021-02-01 DIAGNOSIS — R1013 Epigastric pain: Secondary | ICD-10-CM

## 2021-02-01 DIAGNOSIS — L309 Dermatitis, unspecified: Secondary | ICD-10-CM | POA: Insufficient documentation

## 2021-02-01 MED ORDER — TRIAMCINOLONE ACETONIDE 0.5 % EX OINT: 1.0000 "application " | TOPICAL_OINTMENT | Freq: Two times a day (BID) | CUTANEOUS | 0 refills | Status: DC

## 2021-02-01 NOTE — Assessment & Plan Note (Signed)
Dermatitis - start triamcinolone to skin. Update if no improvement in 2 weeks. Could consider stronger steroid vs derm referral.

## 2021-02-01 NOTE — Assessment & Plan Note (Addendum)
Etiology unclear - based on exam concerning for ulcer vs msk (given hx). She notes RUQ pain but hx not consistent with Gallstones and pt with more epigastric ttp.   Advised trial of omeprazole 20 mg given GERD hx. If no improvement, trial of NSAIDs for possible MSK pain. If no improvement return to clinic to consider labs and imaging.   ER and return precautions discussed

## 2021-02-01 NOTE — Patient Instructions (Signed)
#  Abdominal pain - would recommend omeprazole 20 mg daily for 1-2 weeks - if no improvement (not worsening) consider anti-inflammatory (ibuprofen) for a few days - if still no improvement - then would consider imaging or blood work

## 2021-02-01 NOTE — Progress Notes (Signed)
Subjective:     Miranda Lara is a 55 y.o. female presenting for Abdominal Pain (Under bottom rib. Dull pain today. Felt like something got "stuck" when she bent over on saturday and she pushed "it" back in and it's been sore ever since. ) and Rash (In cleavage area x weeks )     Abdominal Pain This is a new problem. The onset quality is sudden. The problem has been gradually worsening. The pain is located in the RUQ. The quality of the pain is dull and aching. The abdominal pain does not radiate. Associated symptoms include nausea. Pertinent negatives include no constipation, diarrhea or vomiting. The pain is aggravated by certain positions (walking, bending forward). Relieved by: rest. Treatments tried: ibuprofen. The treatment provided no relief.  Rash This is a new problem. The current episode started 1 to 4 weeks ago. The affected locations include the torso. The rash is characterized by itchiness and burning. Associated with: ?bug. Pertinent negatives include no diarrhea or vomiting. Past treatments include topical steroids and antibiotic cream. The treatment provided mild relief. There is no history of eczema.     Review of Systems  Gastrointestinal:  Positive for abdominal pain and nausea. Negative for constipation, diarrhea and vomiting.  Skin:  Positive for rash.    Social History   Tobacco Use  Smoking Status Every Day   Packs/day: 0.50   Types: Cigarettes  Smokeless Tobacco Never        Objective:    BP Readings from Last 3 Encounters:  02/01/21 110/70  10/22/18 129/88  04/02/18 97/61   Wt Readings from Last 3 Encounters:  02/01/21 125 lb 4 oz (56.8 kg)  11/11/18 125 lb (56.7 kg)  10/22/18 125 lb (56.7 kg)    BP 110/70   Pulse 76   Temp 98.6 F (37 C) (Temporal)   Ht '5\' 2"'$  (1.575 m)   Wt 125 lb 4 oz (56.8 kg)   SpO2 99%   BMI 22.91 kg/m    Physical Exam Constitutional:      General: She is not in acute distress.    Appearance: She is  well-developed. She is not diaphoretic.  HENT:     Right Ear: External ear normal.     Left Ear: External ear normal.     Nose: Nose normal.  Eyes:     Conjunctiva/sclera: Conjunctivae normal.  Cardiovascular:     Rate and Rhythm: Normal rate and regular rhythm.     Heart sounds: No murmur heard. Pulmonary:     Effort: Pulmonary effort is normal. No respiratory distress.     Breath sounds: Normal breath sounds. No wheezing.  Abdominal:     General: Abdomen is flat. Bowel sounds are normal. There is no distension.     Palpations: Abdomen is soft.     Tenderness: There is abdominal tenderness in the right upper quadrant, epigastric area and left upper quadrant. There is guarding (abdominal wall tension with palpation). There is no rebound. Negative signs include Murphy's sign.     Hernia: A hernia is present. Hernia is present in the umbilical area (possible umbilical defect. no hernia at area of pain).  Musculoskeletal:     Cervical back: Neck supple.  Skin:    General: Skin is warm and dry.     Capillary Refill: Capillary refill takes less than 2 seconds.  Neurological:     Mental Status: She is alert. Mental status is at baseline.  Psychiatric:  Mood and Affect: Mood normal.        Behavior: Behavior normal.          Assessment & Plan:   Problem List Items Addressed This Visit       Musculoskeletal and Integument   Dermatitis    Dermatitis - start triamcinolone to skin. Update if no improvement in 2 weeks. Could consider stronger steroid vs derm referral.         Other   Epigastric abdominal pain - Primary    Etiology unclear - based on exam concerning for ulcer vs msk (given hx). She notes RUQ pain but hx not consistent with Gallstones and pt with more epigastric ttp.   Advised trial of omeprazole 20 mg given GERD hx. If no improvement, trial of NSAIDs for possible MSK pain. If no improvement return to clinic to consider labs and imaging.         Return  if symptoms worsen or fail to improve.  Lesleigh Noe, MD  This visit occurred during the SARS-CoV-2 public health emergency.  Safety protocols were in place, including screening questions prior to the visit, additional usage of staff PPE, and extensive cleaning of exam room while observing appropriate contact time as indicated for disinfecting solutions.

## 2021-02-03 ENCOUNTER — Telehealth: Payer: Self-pay

## 2021-02-03 DIAGNOSIS — L309 Dermatitis, unspecified: Secondary | ICD-10-CM

## 2021-02-03 DIAGNOSIS — R1013 Epigastric pain: Secondary | ICD-10-CM

## 2021-02-03 MED ORDER — HYDROXYZINE PAMOATE 25 MG PO CAPS: 25.0000 mg | ORAL_CAPSULE | Freq: Three times a day (TID) | ORAL | 0 refills | Status: DC | PRN

## 2021-02-03 NOTE — Addendum Note (Signed)
Addended by: Lesleigh Noe on: 02/03/2021 10:16 AM   Modules accepted: Orders

## 2021-02-03 NOTE — Telephone Encounter (Signed)
Patient called to follow up on her rash on her chest. Saw Dr Einar Pheasant on 02/01/21 and was given Triamcinolone cream. She feels like this made rash worse-began to burn more and itch after applying so she has not applied today. It has been itching and burning so bad especially at evening/night time.  Patient states she googled rash on the chest and saw that it mentioned stress induced rash and the way it described the rash is like what she has, patient said she knows she should not probably google things but she was trying to figure it out. Patient said this woud make sense because the day before the rash appeared she received some awful news about something and was very very upset to the point of having a panic attack. She has a lot going on in her life now and at night is when she thinks of things  that are going on and that is when the rash is worse. Patient said she had to take Benadryl last night at midnight to try and sleep due to rash was so itchy and she could not get comfortable or stop scratching it. Benadryl took the edge off some but it did not resolve it and this morning she is still having itching.  Abdominal pain not worse but not any better also just wanted to update. Patient wanted to get Dr Verda Cumins feedback on this. Thank you.

## 2021-02-03 NOTE — Telephone Encounter (Signed)
That is not the typical reaction to steroid creams.   I've sent in Hydroxyzine which should help with itch. It is also a mild anxiety medication so may help with stress. It may make her sleepy.  She could try a lower dose steroid - hydrocortisone cream to see if the burning still occurs - this is over the counter.   If rash is persisting next week would recommend PCP f/u or can place derm referral.

## 2021-02-03 NOTE — Telephone Encounter (Signed)
Mychart message sent to pt relaying Dr. Verda Cumins message

## 2021-02-17 NOTE — Telephone Encounter (Signed)
I have not seen patient in over 2 years, she will need follow up for continued symptoms. Please have her scheduled.

## 2021-02-18 NOTE — Telephone Encounter (Signed)
FYI, just thought I would touch base with you as I've not seen this patient in 2 years.

## 2021-02-22 NOTE — Addendum Note (Signed)
Addended by: Lesleigh Noe on: 02/22/2021 03:28 PM   Modules accepted: Orders

## 2021-02-23 ENCOUNTER — Other Ambulatory Visit (INDEPENDENT_AMBULATORY_CARE_PROVIDER_SITE_OTHER): Payer: Self-pay

## 2021-02-23 ENCOUNTER — Other Ambulatory Visit: Payer: Self-pay

## 2021-02-23 DIAGNOSIS — R1013 Epigastric pain: Secondary | ICD-10-CM

## 2021-02-23 LAB — COMPREHENSIVE METABOLIC PANEL
ALT: 16 U/L (ref 0–35)
AST: 18 U/L (ref 0–37)
Albumin: 4.7 g/dL (ref 3.5–5.2)
Alkaline Phosphatase: 55 U/L (ref 39–117)
BUN: 9 mg/dL (ref 6–23)
CO2: 27 mEq/L (ref 19–32)
Calcium: 9.9 mg/dL (ref 8.4–10.5)
Chloride: 101 mEq/L (ref 96–112)
Creatinine, Ser: 0.72 mg/dL (ref 0.40–1.20)
GFR: 94.41 mL/min (ref >60.00)
Glucose, Bld: 104 mg/dL — ABNORMAL HIGH (ref 70–99)
Potassium: 4.5 mEq/L (ref 3.5–5.1)
Sodium: 137 mEq/L (ref 135–145)
Total Bilirubin: 1.2 mg/dL (ref 0.2–1.2)
Total Protein: 7.2 g/dL (ref 6.0–8.3)

## 2021-02-23 LAB — CBC WITH DIFFERENTIAL/PLATELET
Basophils Absolute: 0 10*3/uL (ref 0.0–0.1)
Basophils Relative: 0.9 % (ref 0.0–3.0)
Eosinophils Absolute: 0 10*3/uL (ref 0.0–0.7)
Eosinophils Relative: 0.8 % (ref 0.0–5.0)
HCT: 39 % (ref 36.0–46.0)
Hemoglobin: 13.2 g/dL (ref 12.0–15.0)
Lymphocytes Relative: 31 % (ref 12.0–46.0)
Lymphs Abs: 1.5 10*3/uL (ref 0.7–4.0)
MCHC: 33.9 g/dL (ref 30.0–36.0)
MCV: 99 fl (ref 78.0–100.0)
Monocytes Absolute: 0.4 10*3/uL (ref 0.1–1.0)
Monocytes Relative: 8.6 % (ref 3.0–12.0)
Neutro Abs: 2.9 10*3/uL (ref 1.4–7.7)
Neutrophils Relative %: 58.7 % (ref 43.0–77.0)
Platelets: 233 10*3/uL (ref 150.0–400.0)
RBC: 3.94 Mil/uL (ref 3.87–5.11)
RDW: 12.6 % (ref 11.5–15.5)
WBC: 4.9 10*3/uL (ref 4.0–10.5)

## 2021-02-23 LAB — LIPASE: Lipase: 16 U/L (ref 11.0–59.0)

## 2021-03-25 IMAGING — MG DIGITAL SCREENING BILATERAL MAMMOGRAM WITH TOMO AND CAD
8 series · 9 of 24 positions shown · non-contrast
Comparison: Previous exam(s).

CLINICAL DATA: Screening.

EXAM:
DIGITAL SCREENING BILATERAL MAMMOGRAM WITH TOMO AND CAD

[R CC synth-2D]
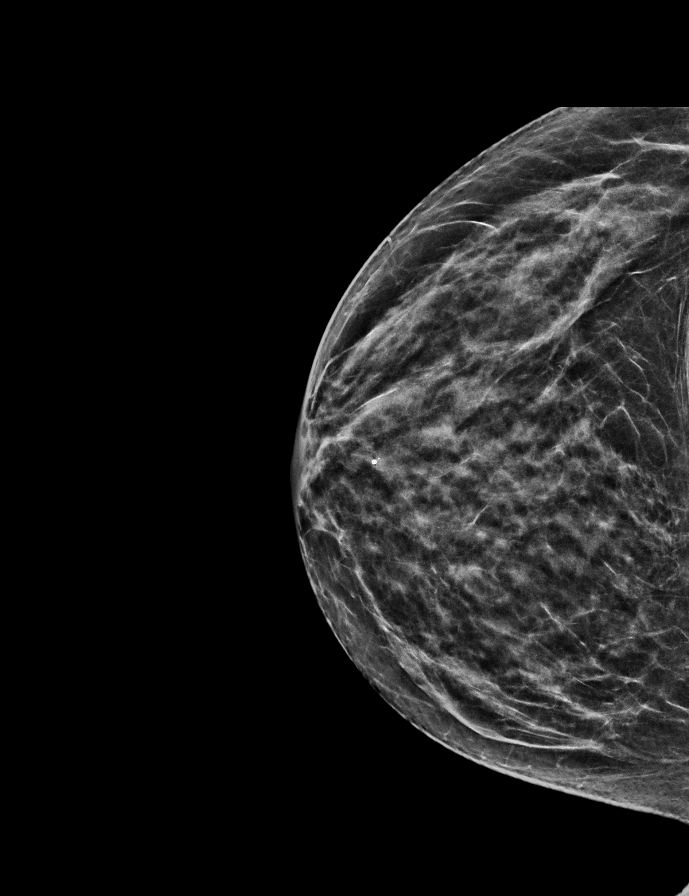

[L CC synth-2D]
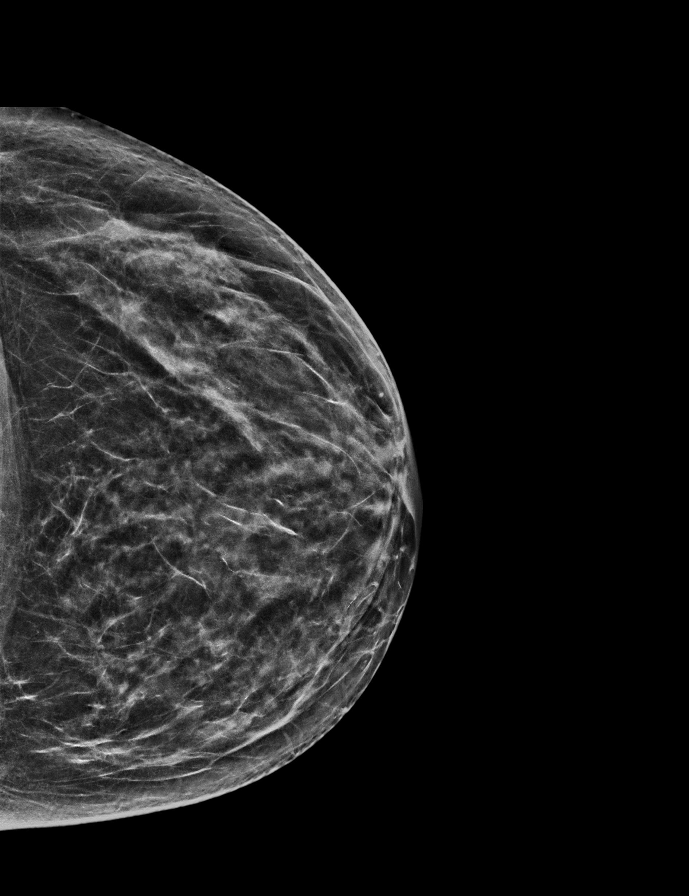

[L MLO synth-2D]
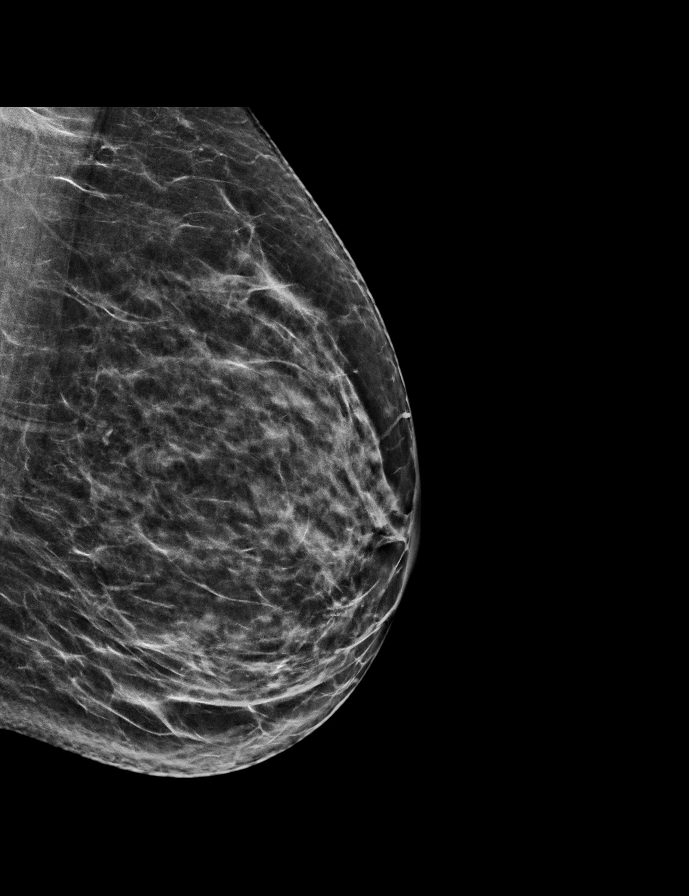

[R MLO synth-2D]
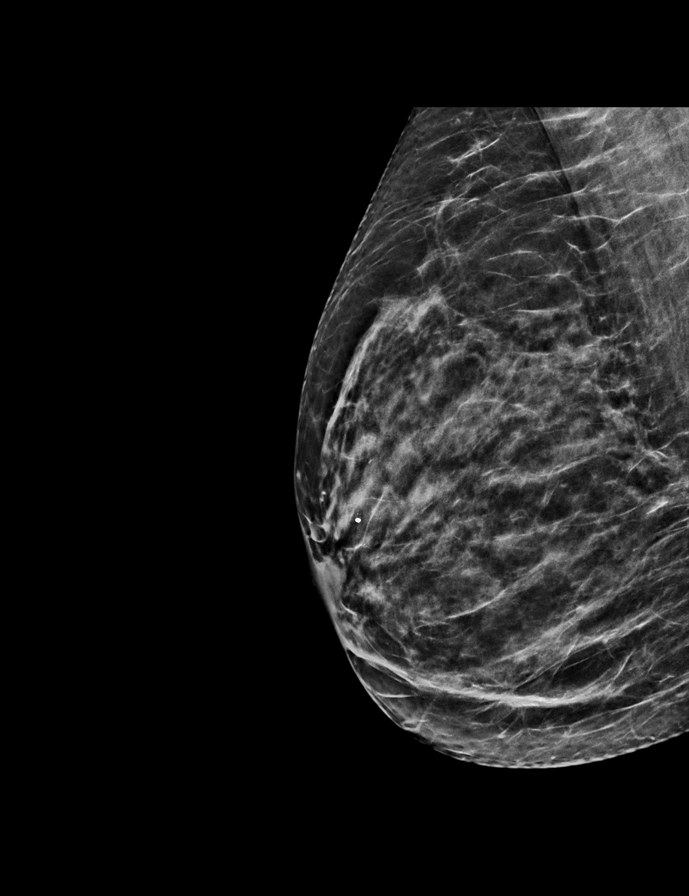

[R CC tomo · 2 of 53 frames shown]
[frame 18/53]
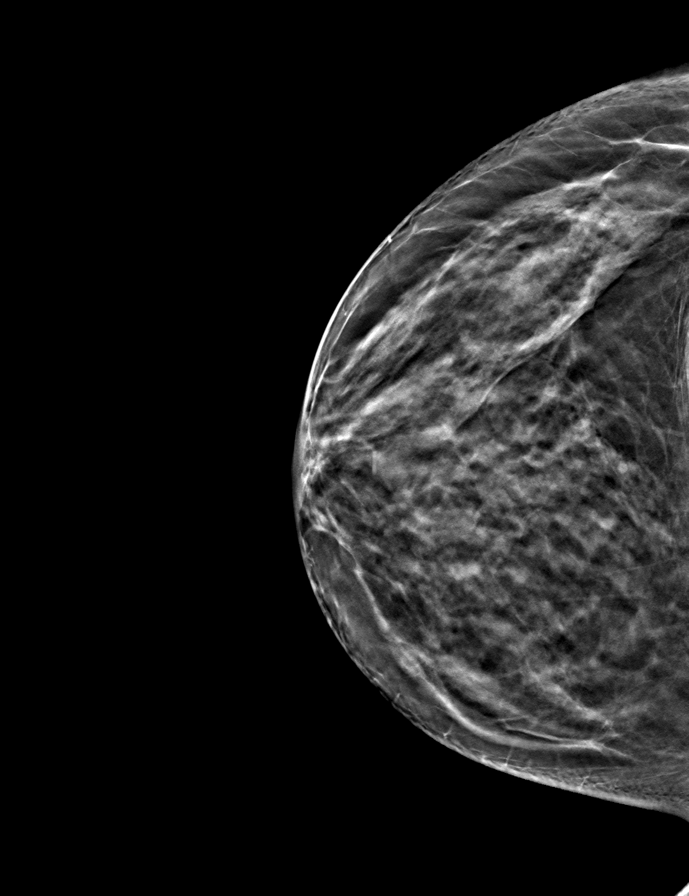
[frame 27/53]
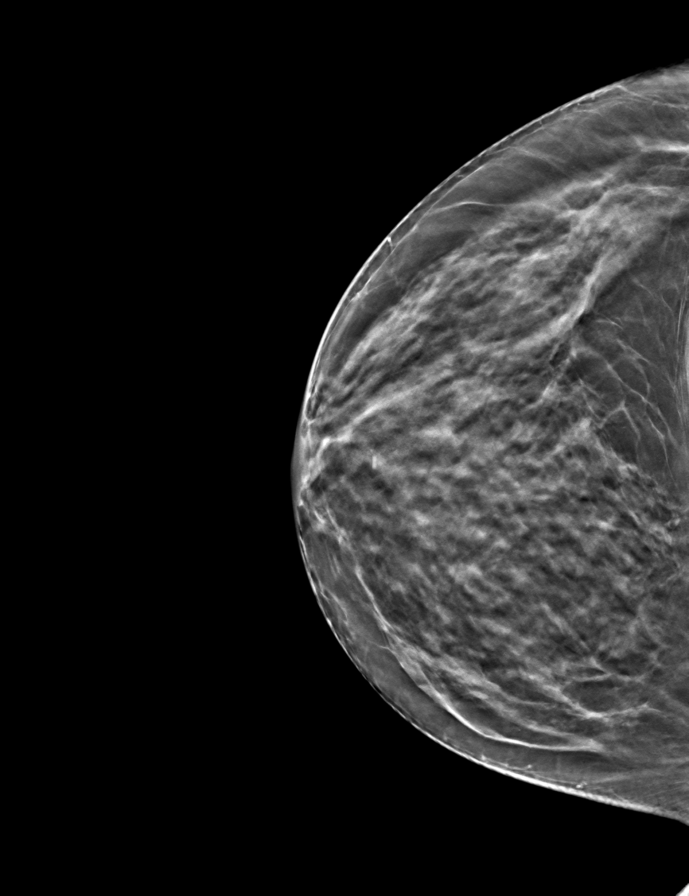

[L CC tomo · tomo slice 29/56.0]
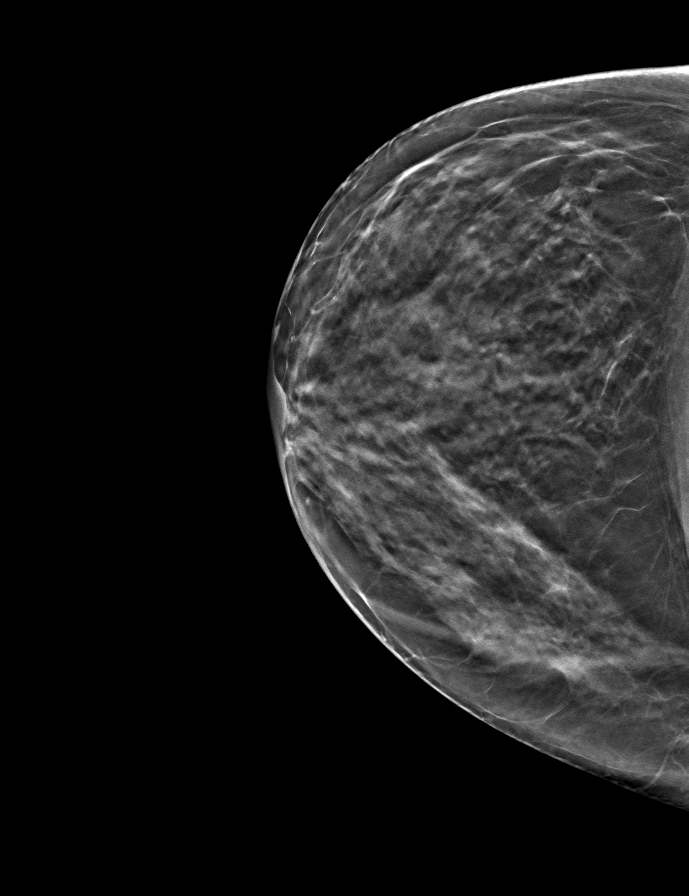

[L MLO tomo · tomo slice 31/60.0]
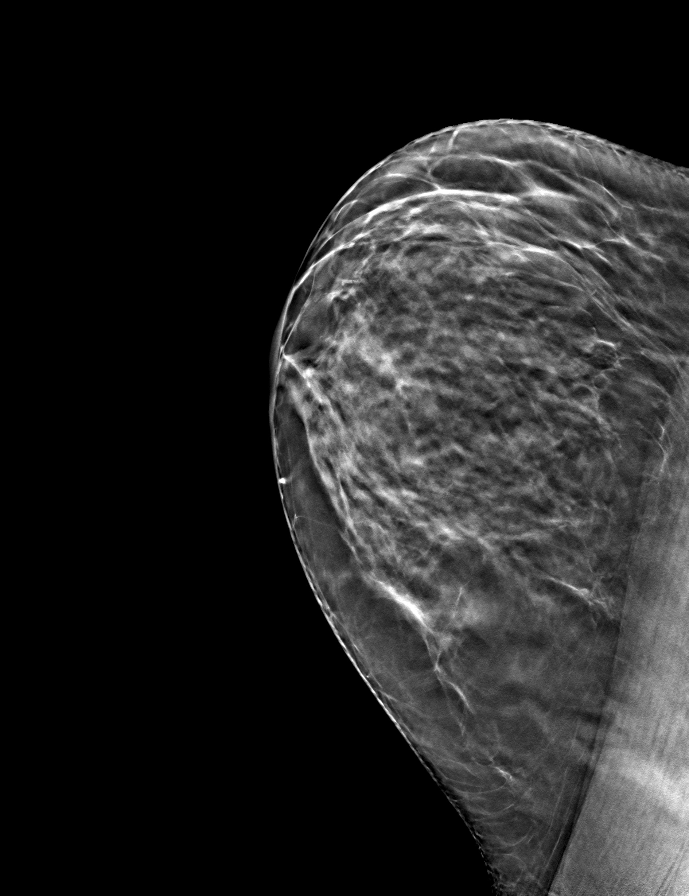

[R MLO tomo · tomo slice 28/55.0]
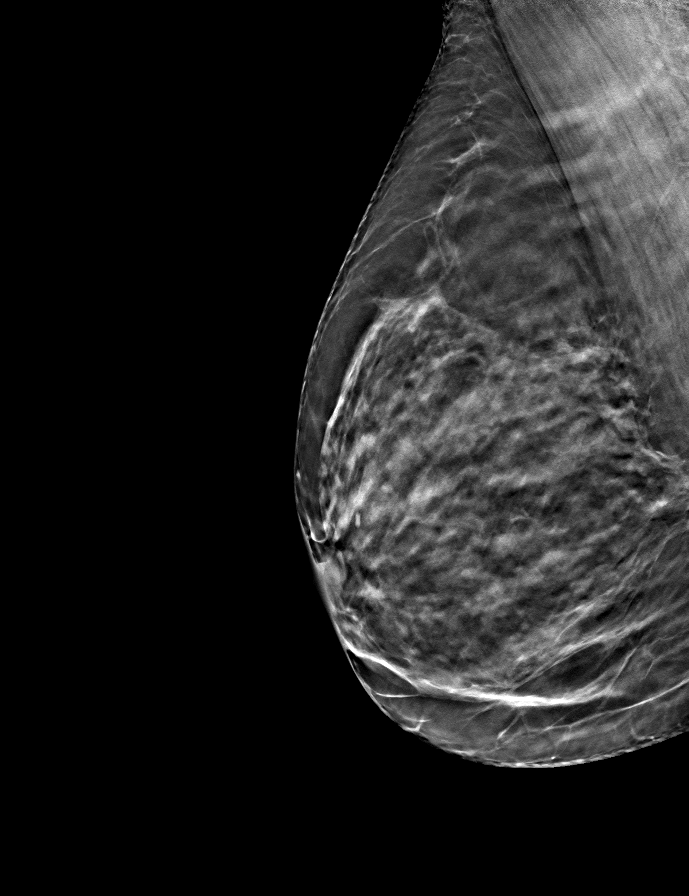

[9 of 24 positions shown; findings below may reference images not displayed]

ACR Breast Density Category c: The breast tissue is heterogeneously
dense, which may obscure small masses.
FINDINGS: There are no findings suspicious for malignancy. Images were
processed with CAD.
IMPRESSION: No mammographic evidence of malignancy. A result letter of this
screening mammogram will be mailed directly to the patient.

RECOMMENDATION:
Screening mammogram in one year. (Code:[id number])

BI-RADS CATEGORY  1: Negative.

## 2023-11-12 ENCOUNTER — Telehealth: Payer: Self-pay

## 2023-11-12 ENCOUNTER — Ambulatory Visit (INDEPENDENT_AMBULATORY_CARE_PROVIDER_SITE_OTHER): Payer: Self-pay | Admitting: Family Medicine

## 2023-11-12 ENCOUNTER — Encounter: Payer: Self-pay | Admitting: Family Medicine

## 2023-11-12 VITALS — BP 102/70 | HR 85 | Temp 99.5°F | Ht 61.5 in | Wt 119.0 lb

## 2023-11-12 DIAGNOSIS — L03114 Cellulitis of left upper limb: Secondary | ICD-10-CM

## 2023-11-12 DIAGNOSIS — T63301A Toxic effect of unspecified spider venom, accidental (unintentional), initial encounter: Secondary | ICD-10-CM

## 2023-11-12 DIAGNOSIS — Z23 Encounter for immunization: Secondary | ICD-10-CM

## 2023-11-12 MED ORDER — CEFTRIAXONE SODIUM 1 G IJ SOLR
1.0000 g | Freq: Once | INTRAMUSCULAR | Status: AC
  Administered 2023-11-12: 1 g via INTRAMUSCULAR

## 2023-11-12 MED ORDER — DOXYCYCLINE HYCLATE 100 MG PO TABS: 100.0000 mg | ORAL_TABLET | Freq: Two times a day (BID) | ORAL | 0 refills | Status: AC

## 2023-11-12 NOTE — Progress Notes (Signed)
 Karcyn Menn T. Daquana Paddock, MD, CAQ Sports Medicine [facility] [address] [place], [address]  Phone: [phone]  FAX: [phone]  Celie Desrochers - 58 y.o. female  MRN [id number]  Date of Birth: [birth year]  Date: 11/12/2023  PCP: Gabriel John, NP  Referral: Gabriel John, NP  Chief Complaint  Patient presents with   Insect Bite    X 1 week ago   Subjective:   Yuliya Nova is a 58 y.o. very pleasant female patient with Body mass index is 22.12 kg/m. who presents with the following:  This is a pleasant 58 year old patient presents with a wound and surrounding redness and warmth near the left elbow.  Photo was captured and is included in the exam portion below.  She is not entirely sure but she felt as if she had some insect bite or an acute pain last week.  She did have some topical redness and pain locally, but the redness started to worsen over the last 2 days over the weekend.  It has felt warm and red and is tender to palpation.  Immunization History  Administered Date(s) Administered   Tdap 02/15/2010, 10/10/2013    Tdap booster  Rocephin 1 gram IM Tdap  Review of Systems is noted in the HPI, as appropriate  Objective:   BP 102/70   Pulse 85   Temp 99.5 F (37.5 C) (Temporal)   Ht 5' 1.5" (1.562 m)   Wt 119 lb (54 kg)   SpO2 99%   BMI 22.12 kg/m   GEN: No acute distress; alert,appropriate. PULM: Breathing comfortably in no respiratory distress PSYCH: Normally interactive.   I marked the reddened area with a permanent marker.  There is some tenderness to palpation throughout the reddened area, at the more local wound as well as some tenderness in the distal upper arm.    Laboratory and Imaging Data:  Assessment and Plan:     ICD-10-CM   1. Cellulitis of arm, left  L03.114 cefTRIAXone (ROCEPHIN) injection 1 g    2. Spider bite wound, accidental or unintentional, initial encounter  T63.301A cefTRIAXone  (ROCEPHIN) injection 1 g    3. Need for Tdap vaccination  Z23 Tdap vaccine greater than or equal to 7yo IM     She has developed some significant cellulitis in the arm.  I am going to give her a gram of Rocephin in the office and have her start taking doxycycline tomorrow.  Of note, she is not allergic to penicillin or amoxicillin.  She did have some nausea and vomiting with Augmentin.  Spider wound versus other insect bite, now infected.  I am going to have her follow-up with her primary care doctor on Wednesday for recheck.  Medication Management during today's office visit: Meds ordered this encounter  Medications   doxycycline (VIBRA-TABS) 100 MG tablet    Sig: Take 1 tablet (100 mg total) by mouth 2 (two) times daily.    Dispense:  20 tablet    Refill:  0   cefTRIAXone (ROCEPHIN) injection 1 g    Antibiotic Indication::   Cellulitis   Medications Discontinued During This Encounter  Medication Reason   hydrOXYzine  (VISTARIL ) 25 MG capsule Completed Course   triamcinolone  ointment (KENALOG ) 0.5 % Completed Course    Orders placed today for conditions managed today: Orders Placed This Encounter  Procedures   Tdap vaccine greater than or equal to 7yo IM    Disposition: Return for Aneta Bar  on Wednesday afternoon.  [employer] speech-to-text software was used for transcription in this dictation.  Possible transcriptional errors can occur using Animal nutritionist.   Signed,  Ranny Bye. Charolette Bultman, MD   Outpatient Encounter Medications as of 11/12/2023  Medication Sig   doxycycline (VIBRA-TABS) 100 MG tablet Take 1 tablet (100 mg total) by mouth 2 (two) times daily.   [DISCONTINUED] hydrOXYzine  (VISTARIL ) 25 MG capsule Take 1 capsule (25 mg total) by mouth 3 (three) times daily as needed for itching.   [DISCONTINUED] triamcinolone  ointment (KENALOG ) 0.5 % Apply 1 application topically 2 (two) times daily.   [EXPIRED] cefTRIAXone (ROCEPHIN) injection 1 g    No  facility-administered encounter medications on file as of 11/12/2023.

## 2023-11-12 NOTE — Telephone Encounter (Signed)
 Copied from CRM [id number]. Topic: Clinical - Medical Advice >> Nov 12, 2023  4:22 PM Albertha Alosa wrote: Reason for CRM: Patient called in wanting to speak with Tanya Fantasia, stated she got an antibiotic injection but also got an prescription, wanted to know do she needs to start the antibiotic today or tomorrow

## 2023-11-12 NOTE — Telephone Encounter (Signed)
 Miranda Lara notified by telephone that she will start the doxycycline tomorrow.  Patient states understanding.

## 2023-11-14 ENCOUNTER — Ambulatory Visit (INDEPENDENT_AMBULATORY_CARE_PROVIDER_SITE_OTHER): Payer: Self-pay | Admitting: Primary Care

## 2023-11-14 ENCOUNTER — Encounter: Payer: Self-pay | Admitting: Primary Care

## 2023-11-14 VITALS — BP 112/62 | HR 78 | Temp 97.8°F | Ht 61.5 in | Wt 118.0 lb

## 2023-11-14 DIAGNOSIS — L03114 Cellulitis of left upper limb: Secondary | ICD-10-CM | POA: Insufficient documentation

## 2023-11-14 NOTE — Progress Notes (Signed)
 Subjective:    Patient ID: Miranda Lara, female    DOB: 02-24-1966, 58 y.o.   MRN: [id number]  Wound Check    Miranda Lara is a very pleasant 59 y.o. female with a history of rheumatoid arthritis, TMJ, Bacot use, dermatitis who presents today for follow-up of facial cellulitis.  Evaluated with Dr. Geralyn Knee on 11/12/2023 for a several day history of left elbow warmth, swelling, redness with open wound.  She was treated with 1 g of Rocephin intramuscularly in the office and provided a prescription for doxycycline 100 mg tablets to take twice daily x 10 days.  Tetanus vaccine was updated.  Her wound was photographed and marked for size during visit.  Since her last visit she's noticed increased drainage of puss. She's noticed a slight reduction in swelling and redness. She has taken a total of three doses of Doxycycline.  She denies increased swelling, increased redness, increased pain, fevers, chills.  She is keeping the wound covered during the day.   Review of Systems  Constitutional:  Negative for chills and fever.  Skin:  Positive for wound.         Past Medical History:  Diagnosis Date   Anxiety and depression    Chickenpox    Endometriosis    Genital warts    Migraines    Ovarian tumor    Rheumatoid arthritis (HCC)     Social History   Socioeconomic History   Marital status: Married    Spouse name: Not on file   Number of children: Not on file   Years of education: Not on file   Highest education level: Not on file  Occupational History   Not on file  Tobacco Use   Smoking status: Every Day    Current packs/day: 0.50    Types: Cigarettes   Smokeless tobacco: Never  Substance and Sexual Activity   Alcohol use: Yes   Drug use: Not on file   Sexual activity: Not on file  Other Topics Concern   Not on file  Social History Narrative   Married.   2 children, 1 grandchild.   Works in [employer].   Enjoys yard work, spending time with family.    Originally from  [place] .    Social Drivers of Corporate investment banker Strain: Not on file  Food Insecurity: Not on file  Transportation Needs: Not on file  Physical Activity: Not on file  Stress: Not on file  Social Connections: Not on file  Intimate Partner Violence: Not on file    Past Surgical History:  Procedure Laterality Date   ABDOMINAL HYSTERECTOMY  2003   BREAST BIOPSY Left 1987   FOOT SURGERY  2016   NECK SURGERY  1986   ACF 5-6   TONSILLECTOMY      Family History  Problem Relation Age of Onset   Hypertension Mother    Dementia Mother    Arthritis Maternal Grandmother    Stroke Maternal Grandmother    Hypertension Maternal Grandmother    Dementia Maternal Grandmother    Prostate cancer Maternal Grandfather    Colon cancer Paternal Grandfather     Allergies  Allergen Reactions   Augmentin [Amoxicillin-Pot Clavulanate] Nausea And Vomiting    Current Outpatient Medications on File Prior to Visit  Medication Sig Dispense Refill   doxycycline (VIBRA-TABS) 100 MG tablet Take 1 tablet (100 mg total) by mouth 2 (two) times daily. 20 tablet 0   No current facility-administered medications on file prior to  visit.    BP 112/62   Pulse 78   Temp 97.8 F (36.6 C) (Temporal)   Ht 5' 1.5" (1.562 m)   Wt 118 lb (53.5 kg)   SpO2 94%   BMI 21.93 kg/m  Objective:   Physical Exam Constitutional:      Appearance: She is not ill-appearing.  Cardiovascular:     Rate and Rhythm: Normal rate.  Pulmonary:     Effort: Pulmonary effort is normal.  Skin:    General: Skin is warm and dry.     Findings: Erythema present.     Comments: Scabbed wound to left elbow with mild surrounding erythema and mild to moderate swelling. Does not move out of line of marking. See pictures.             Assessment & Plan:  Cellulitis of left arm Assessment & Plan: Sequela.  Appears to be mildly improved compared to pictures from 6-25.  See updated pictures from  11/14/2023. Recommended she send updated photos early next week via MyChart or to notify sooner if symptoms do not continue to improve. Continue doxycycline 100 mg twice daily until complete.         Flint Hakeem K Karisma Meiser, NP

## 2023-11-14 NOTE — Assessment & Plan Note (Signed)
 Sequela.  Appears to be mildly improved compared to pictures from 6-25.  See updated pictures from 11/14/2023. Recommended she send updated photos early next week via MyChart or to notify sooner if symptoms do not continue to improve. Continue doxycycline 100 mg twice daily until complete.

## 2024-01-16 ENCOUNTER — Encounter: Payer: Self-pay | Admitting: Primary Care

## 2024-01-28 ENCOUNTER — Encounter: Payer: Self-pay | Admitting: Primary Care

## 2024-02-25 ENCOUNTER — Ambulatory Visit: Admitting: Podiatry

## 2024-05-27 ENCOUNTER — Ambulatory Visit (INDEPENDENT_AMBULATORY_CARE_PROVIDER_SITE_OTHER): Payer: Self-pay | Admitting: Podiatry

## 2024-05-27 ENCOUNTER — Encounter: Payer: Self-pay | Admitting: Podiatry

## 2024-05-27 ENCOUNTER — Ambulatory Visit: Payer: Self-pay

## 2024-05-27 VITALS — Ht 61.5 in | Wt 118.0 lb

## 2024-05-27 DIAGNOSIS — M722 Plantar fascial fibromatosis: Secondary | ICD-10-CM

## 2024-05-27 NOTE — Progress Notes (Signed)
" ° °  Chief Complaint  Patient presents with   Foot Pain    Pt is here due to foot pain from plantar fibroma to the left foot, she has been here for the same thing before, states she noticed it was back about 6 months ago, just been dealing with it, states the pain is constant and wants to get it check out.    Subjective:  Patient presents today for recurrence of plantar fibromas to the bilateral feet. H/o excision of bilateral fibromas. DOS: 03/28/18. Fibroma to the right foot has recurred over the past several months. They are very frustrating and affect her on a daily basis.   Past Medical History:  Diagnosis Date   Anxiety and depression    Chickenpox    Endometriosis    Genital warts    Migraines    Ovarian tumor    Rheumatoid arthritis (HCC)      Past Surgical History:  Procedure Laterality Date   ABDOMINAL HYSTERECTOMY  2003   BREAST BIOPSY Left 1987   FOOT SURGERY  2016   NECK SURGERY  1986   ACF 5-6   TONSILLECTOMY     Allergies[1]  Objective: Physical Exam General: The patient is alert and oriented x3 in no acute distress.  Dermatology: Skin is cool, dry and supple bilateral lower extremities. Negative for open lesions or macerations.  Vascular: Palpable pedal pulses bilaterally. No edema or erythema noted. Capillary refill within normal limits.  Neurological: Grossly intact via light touch  Musculoskeletal Exam: All pedal and ankle joints range of motion within normal limits bilateral. Muscle strength 5/5 in all groups bilateral. Plantar fibromas noted bilateral with the right more symptomatic than the left. Associated tenderness with palpation.   Assessment: 1. s/p excision of bilateral fibromas. DOS: 03/28/18 2. Recalcitrant plantar fibromas bilateral   Plan of Care:  -Patient evaluated.  -Continue wearing good supportive shoes -Patient has a well known history of recalcitrant plantar fascial fibromatosis. Discussed conservative versus surgical  management. She has tried shoe gear modifications and antiinflammatory to help alleviate the fibroma but it continues to be symptomatic.  -Surgery discussed today including excision of plantar fibroma. Risks, benefits, advantages, disadvantages, as well as postoperative recovery course discussed. All questions answered. No guarantees were expressed or implied. Patient opts for surgery -Authorization for surgery initiated today which will consist of excision of plantar fibroma right.  -return to clinic 1 week postop  Thresa EMERSON Sar, DPM [facility]  Dr. Thresa EMERSON Sar, DPM    [address]                                        [place], [place] [address]                Office [phone]  Fax [phone]          [1]  Allergies Allergen Reactions   Augmentin [Amoxicillin-Pot Clavulanate] Nausea And Vomiting   "
